# Patient Record
Sex: Male | Born: 1990 | Race: White | Hispanic: No | Marital: Married | Smoking: Never smoker
Health system: Southern US, Community
[De-identification: ages and names within clinical notes are randomized; demographics above are authoritative.]

## PROBLEM LIST (undated history)

## (undated) DIAGNOSIS — F429 Obsessive-compulsive disorder, unspecified: Secondary | ICD-10-CM

## (undated) DIAGNOSIS — I10 Essential (primary) hypertension: Secondary | ICD-10-CM

## (undated) DIAGNOSIS — K219 Gastro-esophageal reflux disease without esophagitis: Secondary | ICD-10-CM

---
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
## 2019-03-22 ENCOUNTER — Ambulatory Visit: Payer: 59 | Admitting: Psychiatry

## 2019-05-28 ENCOUNTER — Ambulatory Visit: Payer: 59 | Admitting: Psychiatry

## 2019-06-11 ENCOUNTER — Ambulatory Visit: Payer: 59 | Admitting: Psychiatry

## 2019-06-25 ENCOUNTER — Ambulatory Visit: Payer: 59 | Admitting: Psychiatry

## 2019-07-18 ENCOUNTER — Encounter: Payer: Self-pay | Admitting: Psychiatry

## 2019-07-18 ENCOUNTER — Other Ambulatory Visit: Payer: Self-pay

## 2019-07-18 ENCOUNTER — Ambulatory Visit (INDEPENDENT_AMBULATORY_CARE_PROVIDER_SITE_OTHER): Payer: 59 | Admitting: Psychiatry

## 2019-07-18 DIAGNOSIS — F4310 Post-traumatic stress disorder, unspecified: Secondary | ICD-10-CM

## 2019-07-18 DIAGNOSIS — F3181 Bipolar II disorder: Secondary | ICD-10-CM | POA: Diagnosis not present

## 2019-07-18 DIAGNOSIS — F422 Mixed obsessional thoughts and acts: Secondary | ICD-10-CM
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
## 2019-07-25 ENCOUNTER — Ambulatory Visit: Payer: 59 | Admitting: Psychiatry

## 2019-08-05 ENCOUNTER — Ambulatory Visit (INDEPENDENT_AMBULATORY_CARE_PROVIDER_SITE_OTHER): Payer: 59 | Admitting: Psychiatry

## 2019-08-05 ENCOUNTER — Encounter: Payer: Self-pay | Admitting: Psychiatry

## 2019-08-05 DIAGNOSIS — F4310 Post-traumatic stress disorder, unspecified: Secondary | ICD-10-CM | POA: Diagnosis not present

## 2019-08-05 NOTE — Progress Notes (Signed)
[facility] Counselor/Therapist Progress Note  Patient ID: Bruce Cross, MRN: [id number],    Date: 08/05/2019  Time Spent: 50 minutes   Treatment Type: Individual Therapy  Reported Symptoms: anxiety  Mental Status Exam:  Appearance:   Casual     Behavior:  Appropriate  Motor:  Normal  Speech/Language:   Clear and Coherent  Affect:  Appropriate  Mood:  anxious  Thought process:  normal  Thought content:    WNL  Sensory/Perceptual disturbances:    WNL  Orientation:  oriented to person, place, time/date and situation  Attention:  Good  Concentration:  Good  Memory:  WNL  Fund of knowledge:   Good  Insight:    Good  Judgment:   Good  Impulse Control:  Good   Risk Assessment: Danger to Self:  No Self-injurious Behavior: No Danger to Others: No Duty to Warn:no Physical Aggression / Violence:No  Access to Firearms a concern: No  Gang Involvement:No   Subjective: I connected with this patient by an approved telecommunication method (video), with his informed consent, and verifying identity and patient privacy.  I was located at my office and patient at his home.  As needed, we discussed the limitations, risks, and security and privacy concerns associated with telehealth service, including the availability and conditions which currently govern in-person appointments and the possibility that 3rd-party payment Bruce Cross not be fully guaranteed and he Bruce Cross be responsible for charges.  After he indicated understanding, we proceeded with the session.  Also discussed treatment planning, as needed, including ongoing verbal agreement with the plan, the opportunity to ask and answer all questions, his demonstrated understanding of instructions, and his readiness to call the office should symptoms worsen or he feels he is in a crisis state and needs more immediate and tangible assistance.   The client had a video appointment today due to a scheduling conflict he had.  He stated that today he  has a lot of agitation at a subjective units of distress of 7+.  He feels it in his back.  His negative cognition is, "it is too much."  I used eye-movement with the client focusing on his agitation.  As he processed he did feel his subjective units of distress dropped down to about around a 5.  When he thought specifically about the CPR class he had to take it went back up to a 7+.  The client noticed that when he was tracking my fingers and just focusing on that that his anxiety went down.  I explained to the client that this is type of mindfulness that he was doing.  I went through that process with him having him sip water to focus on how that tasted in his mouth.  I will also send him a handout on mindfulness practices.  The client was also able to use the eye-movement to invite Bruce Cross into the picture the CPR class that he was taking.  He felt comfort about being able to do that.  He thought if he could have something to focus on in the CPR class then he might feel better making it through.  I suggested the use something for his hand on the baby in the central oil to smell.  The client thought that either 1 of these could work.  His positive cognition was, "I can tolerate this."  Interventions: Mindfulness Meditation, Motivational Interviewing, Solution-Oriented/Positive Psychology, [employer] Desensitization and Reprocessing (EMDR) and Insight-Oriented  Diagnosis:   ICD-10-CM   1.  PTSD (post-traumatic stress disorder)  F43.10     Plan: Mindfulness, mood independent behavior, positive self talk.  Bruce Cross, [facility]

## 2019-09-18 ENCOUNTER — Encounter (HOSPITAL_BASED_OUTPATIENT_CLINIC_OR_DEPARTMENT_OTHER): Payer: Self-pay

## 2019-09-18 ENCOUNTER — Other Ambulatory Visit: Payer: Self-pay

## 2019-09-18 ENCOUNTER — Emergency Department (HOSPITAL_BASED_OUTPATIENT_CLINIC_OR_DEPARTMENT_OTHER): Payer: 59

## 2019-09-18 ENCOUNTER — Observation Stay (HOSPITAL_BASED_OUTPATIENT_CLINIC_OR_DEPARTMENT_OTHER)
Admission: EM | Admit: 2019-09-18 | Discharge: 2019-09-19 | Disposition: A | Discharge status: Home / Self Care | Payer: 59 | Attending: Internal Medicine | Admitting: Internal Medicine

## 2019-09-18 DIAGNOSIS — I639 Cerebral infarction, unspecified: Secondary | ICD-10-CM | POA: Diagnosis not present

## 2019-09-18 DIAGNOSIS — F429 Obsessive-compulsive disorder, unspecified: Secondary | ICD-10-CM | POA: Diagnosis present

## 2019-09-18 DIAGNOSIS — Z8673 Personal history of transient ischemic attack (TIA), and cerebral infarction without residual deficits: Secondary | ICD-10-CM | POA: Diagnosis present

## 2019-09-18 DIAGNOSIS — Z79899 Other long term (current) drug therapy: Secondary | ICD-10-CM | POA: Diagnosis not present

## 2019-09-18 DIAGNOSIS — I10 Essential (primary) hypertension: Secondary | ICD-10-CM | POA: Insufficient documentation

## 2019-09-18 DIAGNOSIS — R739 Hyperglycemia, unspecified: Secondary | ICD-10-CM | POA: Diagnosis present

## 2019-09-18 DIAGNOSIS — K219 Gastro-esophageal reflux disease without esophagitis: Secondary | ICD-10-CM | POA: Diagnosis present

## 2019-09-18 DIAGNOSIS — Z20822 Contact with and (suspected) exposure to covid-19: Secondary | ICD-10-CM | POA: Insufficient documentation

## 2019-09-18 DIAGNOSIS — Z6841 Body Mass Index (BMI) 40.0 and over, adult: Secondary | ICD-10-CM | POA: Diagnosis not present

## 2019-09-18 DIAGNOSIS — R4701 Aphasia: Secondary | ICD-10-CM | POA: Diagnosis present

## 2019-09-18 DIAGNOSIS — E119 Type 2 diabetes mellitus without complications: Secondary | ICD-10-CM | POA: Diagnosis not present

## 2019-09-18 DIAGNOSIS — I63412 Cerebral infarction due to embolism of left middle cerebral artery: Secondary | ICD-10-CM

## 2019-09-18 HISTORY — DX: Obsessive-compulsive disorder, unspecified: F42.9

## 2019-09-18 HISTORY — DX: Gastro-esophageal reflux disease without esophagitis: K21.9

## 2019-09-18 HISTORY — DX: Essential (primary) hypertension: I10

## 2019-09-18 LAB — URINALYSIS, MICROSCOPIC (REFLEX): Squamous Epithelial / HPF: NONE SEEN (ref 0–5)

## 2019-09-18 LAB — BASIC METABOLIC PANEL
Anion gap: 14 (ref 5–15)
BUN: 19 mg/dL (ref 6–20)
CO2: 22 mmol/L (ref 22–32)
Calcium: 10 mg/dL (ref 8.9–10.3)
Chloride: 96 mmol/L — ABNORMAL LOW (ref 98–111)
Creatinine, Ser: 1.01 mg/dL (ref 0.61–1.24)
GFR calc Af Amer: >60 mL/min (ref >60)
GFR calc non Af Amer: >60 mL/min (ref >60)
Glucose, Bld: 276 mg/dL — ABNORMAL HIGH (ref 70–99)
Potassium: 4.3 mmol/L (ref 3.5–5.1)
Sodium: 132 mmol/L — ABNORMAL LOW (ref 135–145)

## 2019-09-18 LAB — URINALYSIS, ROUTINE W REFLEX MICROSCOPIC
Bilirubin Urine: NEGATIVE
Glucose, UA: >=500 mg/dL — AB
Hgb urine dipstick: NEGATIVE
Ketones, ur: NEGATIVE mg/dL
Leukocytes,Ua: NEGATIVE
Nitrite: NEGATIVE
Protein, ur: NEGATIVE mg/dL
Specific Gravity, Urine: 1.015 (ref 1.005–1.030)
pH: 6 (ref 5.0–8.0)

## 2019-09-18 LAB — CBC
HCT: 45.6 % (ref 39.0–52.0)
Hemoglobin: 15.3 g/dL (ref 13.0–17.0)
MCH: 27.8 pg (ref 26.0–34.0)
MCHC: 33.6 g/dL (ref 30.0–36.0)
MCV: 82.8 fL (ref 80.0–100.0)
Platelets: 261 10*3/uL (ref 150–400)
RBC: 5.51 MIL/uL (ref 4.22–5.81)
RDW: 13.5 % (ref 11.5–15.5)
WBC: 9.2 10*3/uL (ref 4.0–10.5)
nRBC: 0 % (ref 0.0–0.2)

## 2019-09-18 LAB — SARS CORONAVIRUS 2 BY RT PCR (HOSPITAL ORDER, PERFORMED IN ~~LOC~~ HOSPITAL LAB): SARS Coronavirus 2: NEGATIVE

## 2019-09-18 MED ORDER — IOHEXOL 350 MG/ML SOLN
100.0000 mL | Freq: Once | INTRAVENOUS | Status: AC | PRN
  Administered 2019-09-18: 100 mL via INTRAVENOUS

## 2019-09-18 MED ORDER — STROKE: EARLY STAGES OF RECOVERY BOOK
Freq: Once | Status: AC
  Filled 2019-09-18: qty 1

## 2019-09-18 MED ORDER — ASPIRIN 81 MG PO CHEW
650.0000 mg | CHEWABLE_TABLET | Freq: Once | ORAL | Status: AC
  Administered 2019-09-18: 650 mg via ORAL
  Filled 2019-09-18: qty 9

## 2019-09-18 MED ORDER — LACTATED RINGERS IV BOLUS
1000.0000 mL | Freq: Once | INTRAVENOUS | Status: AC
  Administered 2019-09-18: 1000 mL via INTRAVENOUS

## 2019-09-18 NOTE — Consult Note (Signed)
Neurology Consultation  Reason for Consult: Word finding difficulty, abnormal CT head Referring Physician: Dr. Gwyneth Sprout, EDP and Dr. Benita Gutter, [facility] hospitalist  CC: Word finding difficulty  History is obtained from: Patient, chart  HPI: Bruce Cross is a 29 y.o. male past medical history of hypertension, PTSD, who is a [employer] manager, presented to [facility] emergency room for evaluation of word finding difficulty. 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  He felt like he was having trouble getting his words out.  One of his colleagues thought he had overheated and provided some fluids.  Symptoms did not really resolve and continued for about 1 to 2 hours.  He went home, his wife noted that he continued to have some garbled speech and the sentences that he was forming were fluent but made no sense.  He also started having some headache on the left side of his head.  No visual deficits.  No tingling numbness.  No coordination deficits.  No gait abnormality. Symptoms did not resolve and that made him come to the emergency room at [facility].  Noncontrast head CT was done because he exhibited signs of expressive aphasia on the ED provider examination.  The CT head revealed 6 cm area of low density in the left temporoparietal junction most consistent with acute infarction, with other differentials being mass.  CT angiogram of the head did not reveal any intracranial vascular occlusion. Denies any head trauma.  Denies any history of headaches.  Denies any prior similar episodes. Denies history of diabetes although sugars were very high in the 270 range on arrival and urine had glycosuria as well. Reports family history of lupus in the mother. Denies any family history of clotting disorders, strokes or heart attacks in young. Denies illicit drug use.  Denies neck pain or neck injury   LKW: 4 PM 09/17/2019 tpa  given?: no, outside the window Premorbid modified Rankin scale (mRS): 0  ROS: Negative except as noted in HPI.  Somewhat limited by his expressive aphasia.  Past Medical History:  Diagnosis Date  . GERD (gastroesophageal reflux disease)   . Hypertension   . OCD (obsessive compulsive disorder)     Family history: Mother-SLE  Social History:   reports that he has never smoked. He has never used smokeless tobacco. He reports previous alcohol use. He reports that he does not use drugs.  Medications  Current Facility-Administered Medications:  .   stroke: mapping our early stages of recovery book, , Does not apply, Once, Milon Dikes, MD   Exam: Current vital signs: BP (!) 134/92 (BP Location: Right Arm)   Pulse 100   Temp 98.5 F (36.9 C) (Oral)   Resp 18   Ht 5\' 11"  (1.803 m)   Wt (!) 168.4 kg   SpO2 97%   BMI 51.78 kg/m  Vital signs in last 24 hours: Temp:  [98.5 F (36.9 C)-99.3 F (37.4 C)] 98.5 F (36.9 C) (08/25 2223) Pulse Rate:  [96-113] 100 (08/25 2223) Resp:  [12-29] 18 (08/25 2223) BP: (134-165)/(87-106) 134/92 (08/25 2223) SpO2:  [95 %-99 %] 97 % (08/25 2223) Weight:  [167.8 kg-168.4 kg] 168.4 kg (08/25 2223) General: Awake alert in no distress, obese. HEENT: Normocephalic atraumatic CVS: Regular rhythm Respiratory: Breathing well saturating normally on room air Abdomen: Obese, nontender Extremities warm well perfused Neurological examination awake alert oriented x3 Speech is nondysarthric Is able to say 2-3 word sentences  normally but when asked to read the long sentences, speech becomes completely garbled and incomprehensible although remains fluent. Naming is intact.  Comprehension is intact.  On repetition-able to repeat short sentences but repetition of long sentences repeats to incomprehensible words. Gets frustrated while trying to speak long sentences due to the above. Cranial: Pupils equal round react light, extraocular movements intact,  visual fields are full without extinction to double simultaneous stimulation, facial sensation is intact, face is symmetric, auditory acuity intact, palate midline, shoulder shrug intact, tongue midline. Motor exam: All 4 extremities are antigravity full-strength with normal tone and normal range of motion. Sensory exam: Intact to light touch without extinction on double 70 stimulation Coordination: Intact finger nose testing bilaterally Gait testing deferred at this time NIH stroke scale-1 for aphasia.   Labs I have reviewed labs in epic and the results pertinent to this consultation are:  CBC    Component Value Date/Time   WBC 9.2 09/18/2019 1409   RBC 5.51 09/18/2019 1409   HGB 15.3 09/18/2019 1409   HCT 45.6 09/18/2019 1409   PLT 261 09/18/2019 1409   MCV 82.8 09/18/2019 1409   MCH 27.8 09/18/2019 1409   MCHC 33.6 09/18/2019 1409   RDW 13.5 09/18/2019 1409    CMP     Component Value Date/Time   NA 132 (L) 09/18/2019 1409   K 4.3 09/18/2019 1409   CL 96 (L) 09/18/2019 1409   CO2 22 09/18/2019 1409   GLUCOSE 276 (H) 09/18/2019 1409   BUN 19 09/18/2019 1409   CREATININE 1.01 09/18/2019 1409   CALCIUM 10.0 09/18/2019 1409   GFRNONAA >60 09/18/2019 1409   GFRAA >60 09/18/2019 1409   Glucose 276, sodium 132, chloride 96.   Imaging I have reviewed the images obtained:  CT-scan of the brain-6 cm region of hypodensity in the left temporoparietal junction most consistent with acute infarction. Other differentials could include a mass. Other considerations given young age is dural venous sinus thrombosis causing venous infarct  Assessment:  29 year old with past medical history of obesity, hypertension, PTSD and also probably undiagnosed diabetes, presenting for evaluation of sudden onset of word finding difficulty with last known normal somewhere around 4 PM on 09/17/2019. CT head concerning for a hypodensity in the left temporoparietal junction.  Most likely consistent  with a stroke.  Differentials include a mass as well but less likely. Given the location of the hypodensity, venous infarct is also possible due to dural venous sinus thrombosis (he was overheated, dehydrated in the sun coaching could have precipitated dural venous sinus thrombosis) Other possibilities are dissection of the cervical carotids. CTA head with no obvious thrombus.  CTA neck was not done at the outside ER. Needs further imaging and work-up  Impression: Acute ischemic stroke in young: Potential etiological factors: Hypercoagulable state given family history of lupus, cervical carotid artery dissection, dural venous sinus thrombosis, or underlying risk factors such as undiagnosed diabetes and hypertension and obesity.  Recommendations: -Admit to hospitalist -Telemetry -Frequent neurochecks -MRI brain with and without contrast -MRA of the head without contrast -MRA of the neck with and without contrast -MRV head to rule out dural venous sinus thrombosis -I have taken the liberty to order all the imaging studies. -Hypercoagulable panel including work-up for SLE-ordered -A1c-ordered -Lipid panel-ordered -Transthoracic echocardiogram -ordered.  Might need TEE but will await TTE results before deciding on that. -PT OT speech therapy -N.p.o. until cleared by bedside swallow evaluation-his RN informed me that he is passed bedside swallow  evaluation.  Can have heart healthy carb modified diet. -Allow for permissive hypertension-treat only if systolic blood pressures are greater than 220 on a as needed basis. -Has been given aspirin 650 mg per conversation between the EDP and the daytime neurology MD.  I would continue aspirin 81 for now. -I would also do high intensity statin-atorvastatin 80 mg now and daily  I have answered all his questions. Stroke team will follow with you.  -- Milon Dikes, MD [facility] Pager: [phone] If 7pm to 7am, please call on call as  listed on AMION.

## 2019-09-18 NOTE — ED Triage Notes (Signed)
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 Left orbital HA, clear speak in triage, denies blurry vision.

## 2019-09-18 NOTE — ED Provider Notes (Addendum)
[facility] EMERGENCY DEPARTMENT Provider Note   CSN: [id number] Arrival date & time: 09/18/19  1348     History No chief complaint on file.   Bruce Cross is a 29 y.o. male.  Patient is a 29 year old male with a history of hypertension, GERD and OCD who presents today with expressive aphasia.  Patient's wife gives most of the history.  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  He was treated by the trainer who noted at that time he was having some word finding difficulty.  However once he cooled down he seemed to improve and went home around 430 5:00.  When his wife got home at 7 she noticed that things were not right.  She reports that he cannot say what he wants to say and will oftentimes say words that are not appropriate in a sentence.  He attempted to read a book to his child this morning and could not get the words out right.  Patient here stated a sentence where he was fire instead of hot.  He seems very frustrated when trying to speak.  However denies any walking abnormalities, unilateral weakness or numbness.  He is complaining of the pain in his left temple area that is been present since yesterday it is constant and nagging but not the worst of his life.  He denies any vision changes.  No prior history of similar symptoms.  He does have a history of hypertension and has not missed any of his blood pressure medication.  He has not received the Covid vaccine but did have Covid last fall.  He denies any recent cough congestion or fever.  The history is provided by the patient.       Past Medical History:  Diagnosis Date  . GERD (gastroesophageal reflux disease)   . Hypertension   . OCD (obsessive compulsive disorder)     There are no problems to display for this patient.   History reviewed. No pertinent surgical history.     History reviewed. No pertinent family history.  Social History    Tobacco Use  . Smoking status: Never Smoker  . Smokeless tobacco: Never Used  Substance Use Topics  . Alcohol use: Not Currently  . Drug use: Never    Home Medications Prior to Admission medications   Medication Sig Start Date End Date Taking? Authorizing Provider  ARIPiprazole (ABILIFY) 15 MG tablet Take 1 tablet by mouth daily. 09/08/19  Yes [provider]  fluvoxaMINE (LUVOX) 100 MG tablet 300 mg. 09/05/19  Yes [provider]  mesalamine (PENTASA) 250 MG CR capsule Take 2,000 mg by mouth 2 (two) times daily.   Yes [provider]  riluzole (RILUTEK) 50 MG tablet 100 mg. 09/08/19  Yes [provider]  lisinopril (ZESTRIL) 20 MG tablet Take 20 mg by mouth 2 (two) times daily.    [provider]  methylphenidate (RITALIN LA) 40 MG 24 hr capsule Take 120 mg by mouth daily. 07/26/19   [provider]    Allergies    Patient has no allergy information on record.  Review of Systems   Review of Systems  All other systems reviewed and are negative.   Physical Exam Updated Vital Signs BP (!) 152/106 (BP Location: Right Wrist)   Pulse (!) 108   Temp 99.3 F (37.4 C) (Oral)   Resp 12   Ht 5\' 11"  (1.803 m)  Wt (!) 167.8 kg   SpO2 99%   BMI 51.60 kg/m   Physical Exam Vitals and nursing note reviewed.  Constitutional:      General: He is not in acute distress.    Appearance: Normal appearance. He is well-developed. He is obese.  HENT:     Head: Normocephalic and atraumatic.  Eyes:     General: No visual field deficit.    Conjunctiva/sclera: Conjunctivae normal.     Pupils: Pupils are equal, round, and reactive to light.  Cardiovascular:     Rate and Rhythm: Regular rhythm. Tachycardia present.     Heart sounds: No murmur heard.   Pulmonary:     Effort: Pulmonary effort is normal. No respiratory distress.     Breath sounds: Normal breath sounds. No wheezing or rales.  Abdominal:     General: There is no distension.      Palpations: Abdomen is soft.     Tenderness: There is no abdominal tenderness. There is no guarding or rebound.  Musculoskeletal:        General: No tenderness. Normal range of motion.     Cervical back: Normal range of motion and neck supple. No tenderness.  Skin:    General: Skin is warm and dry.     Capillary Refill: Capillary refill takes less than 2 seconds.     Findings: No erythema or rash.  Neurological:     Mental Status: He is alert and oriented to person, place, and time.     Cranial Nerves: Dysarthria present. No facial asymmetry.     Motor: Motor function is intact. No weakness or pronator drift.     Coordination: Coordination is intact. [facility] normal.     Gait: Gait is intact.     Comments: Expressive aphasia  Psychiatric:        Behavior: Behavior normal.     ED Results / Procedures / Treatments   Labs (all labs ordered are listed, but only abnormal results are displayed) Labs Reviewed  BASIC METABOLIC PANEL - Abnormal; Notable for the following components:      Result Value   Sodium 132 (*)    Chloride 96 (*)    Glucose, Bld 276 (*)    All other components within normal limits  URINALYSIS, ROUTINE W REFLEX MICROSCOPIC - Abnormal; Notable for the following components:   Glucose, UA >=500 (*)    All other components within normal limits  URINALYSIS, MICROSCOPIC (REFLEX) - Abnormal; Notable for the following components:   Bacteria, UA MANY (*)    All other components within normal limits  SARS CORONAVIRUS 2 BY RT PCR ([facility]OSP[facility])  CBC    EKG EKG Interpretation  Date/Time:  Wednesday September 18 2019 13:57:13 EDT Ventricular Rate:  111 PR Interval:  136 QRS Duration: 86 QT Interval:  330 QTC Calculation: 448 R Axis:   81 Text Interpretation: Sinus tachycardia Otherwise normal ECG No previous tracing Confirmed by Gwyneth SproutPlunkett, Foster Frericks ([id number]) on 09/18/2019 4:58:52 PM   Radiology CT Angio Head W or Wo  Contrast  Result Date: 09/18/2019 CLINICAL DATA:  Confusion and speech disturbance last night. EXAM: CT ANGIOGRAPHY HEAD TECHNIQUE: Multidetector CT imaging of the head was performed using the standard protocol during bolus administration of intravenous contrast. Multiplanar CT image reconstructions and MIPs were obtained to evaluate the vascular anatomy. CONTRAST:  100mL OMNIPAQUE IOHEXOL 350 MG/ML SOLN COMPARISON:  None. FINDINGS: CT HEAD Brain: The brainstem and cerebellum are  normal. There is a 5-6 cm region of low-density in the left temporoparietal junction region most consistent with acute infarction. Mass lesion is possible but not favored. No sign of hemorrhage. No midline shift. No other brain abnormality. No hydrocephalus or extra-axial collection. Vascular: Negative on the standard head CT. Skull: Normal Sinuses: Clear Orbits: Normal CTA HEAD Anterior circulation: Both internal carotid arteries are widely patent through the skull base and siphon regions. The anterior and middle cerebral vessels are patent without proximal stenosis, aneurysm or vascular malformation. I cannot identify an occluded middle cerebral artery branch. Posterior circulation: Both vertebral arteries widely patent to the basilar. No basilar stenosis. Posterior circulation branch vessels are patent. Left posterior cerebral artery receives most of it supply from the anterior circulation. Venous sinuses: Patent and normal. Anatomic variants: None significant. IMPRESSION: 1. 5-6 cm region of low-density in the left temporoparietal junction region most consistent with acute infarction. Mass lesion is possible but not favored. No sign of hemorrhage. 2. No intracranial large or medium vessel occlusion at this time, or correctable proximal stenosis. I cannot identify an occluded left middle cerebral artery branch. Electronically Signed   By: Paulina Fusi M.D.   On: 09/18/2019 17:37    Procedures Procedures (including critical care  time)  Medications Ordered in ED Medications  lactated ringers bolus 1,000 mL (1,000 mLs Intravenous New Bag/Given 09/18/19 1707)    ED Course  I have reviewed the triage vital signs and the nursing notes.  Pertinent labs & imaging results that were available during my care of the patient were reviewed by me and considered in my medical decision making (see chart for details).    MDM Rules/Calculators/A&P                          Patient presenting today with symptoms concerning for stroke versus head bleed.  Could be heat related stroke.  Patient has expressive aphasia on my exam.  Patient is not a code stroke his symptoms have now been present for over 24 hours.  He has no LVO findings.  Labs show hyperglycemia of 276 but otherwise normal CBC and renal function.  EKG with sinus tachycardia but otherwise normal.  Vital signs with persistent tachycardia and hypertension.  We will do a CT/CTA of the head.  Will discuss with neurology.  Patient given 1 L of fluid.  5:57 PM CT/CTA of the head shows normal vascular structures but 5 to 6 cm region of low density in the left temporoparietal junction region most consistent with acute infarct.  Given patient's ongoing symptoms concerning for stroke and now CT findings feel that he needs admission for stroke work-up.  Will discuss with neurology and admit to the hospitalist. Dr. Otelia Limes recommended 650 ASA and 81mg  daily MDM Number of Diagnoses or Management Options   Amount and/or Complexity of Data Reviewed Clinical lab tests: ordered and reviewed Tests in the radiology section of CPT: ordered and reviewed Tests in the medicine section of CPT: reviewed and ordered Decide to obtain previous medical records or to obtain history from someone other than the patient: yes Obtain history from someone other than the patient: yes Review and summarize past medical records: yes Discuss the patient with other providers: yes Independent visualization of  images, tracings, or specimens: yes  Risk of Complications, Morbidity, and/or Mortality Presenting problems: high Diagnostic procedures: low Management options: low  Patient Progress Patient progress: stable    Final Clinical Impression(s) / ED  Diagnoses Final diagnoses:  Cerebrovascular accident (CVA), unspecified mechanism [facility])    Rx / DC Orders ED Discharge Orders    None       Gwyneth Sprout, MD 09/18/19 1759    Gwyneth Sprout, MD 09/18/19 [phone]

## 2019-09-19 ENCOUNTER — Encounter (HOSPITAL_COMMUNITY): Payer: Self-pay | Admitting: Family Medicine

## 2019-09-19 ENCOUNTER — Observation Stay (HOSPITAL_COMMUNITY): Payer: 59

## 2019-09-19 ENCOUNTER — Observation Stay (HOSPITAL_BASED_OUTPATIENT_CLINIC_OR_DEPARTMENT_OTHER): Payer: 59

## 2019-09-19 ENCOUNTER — Encounter (HOSPITAL_COMMUNITY): Payer: Self-pay | Admitting: Certified Registered"

## 2019-09-19 ENCOUNTER — Encounter (HOSPITAL_COMMUNITY)
Admission: EM | Disposition: A | Discharge status: Home / Self Care | Payer: Self-pay | Source: Home / Self Care | Attending: Emergency Medicine

## 2019-09-19 DIAGNOSIS — K219 Gastro-esophageal reflux disease without esophagitis: Secondary | ICD-10-CM | POA: Diagnosis present

## 2019-09-19 DIAGNOSIS — Z9989 Dependence on other enabling machines and devices: Secondary | ICD-10-CM

## 2019-09-19 DIAGNOSIS — I10 Essential (primary) hypertension: Secondary | ICD-10-CM | POA: Diagnosis present

## 2019-09-19 DIAGNOSIS — F429 Obsessive-compulsive disorder, unspecified: Secondary | ICD-10-CM | POA: Diagnosis not present

## 2019-09-19 DIAGNOSIS — Q211 Atrial septal defect: Secondary | ICD-10-CM

## 2019-09-19 DIAGNOSIS — I6389 Other cerebral infarction: Secondary | ICD-10-CM | POA: Diagnosis not present

## 2019-09-19 DIAGNOSIS — I639 Cerebral infarction, unspecified: Secondary | ICD-10-CM | POA: Diagnosis not present

## 2019-09-19 DIAGNOSIS — I63412 Cerebral infarction due to embolism of left middle cerebral artery: Secondary | ICD-10-CM | POA: Diagnosis not present

## 2019-09-19 DIAGNOSIS — R739 Hyperglycemia, unspecified: Secondary | ICD-10-CM | POA: Diagnosis not present

## 2019-09-19 DIAGNOSIS — G4733 Obstructive sleep apnea (adult) (pediatric): Secondary | ICD-10-CM

## 2019-09-19 DIAGNOSIS — E1165 Type 2 diabetes mellitus with hyperglycemia: Secondary | ICD-10-CM

## 2019-09-19 LAB — LDL CHOLESTEROL, DIRECT: Direct LDL: 214 mg/dL — ABNORMAL HIGH (ref 0–99)

## 2019-09-19 LAB — RPR
RPR Ser Ql: NONREACTIVE
RPR Ser Ql: NONREACTIVE

## 2019-09-19 LAB — VITAMIN B12
Vitamin B-12: 436 pg/mL (ref 180–914)
Vitamin B-12: 487 pg/mL (ref 180–914)

## 2019-09-19 LAB — ANTITHROMBIN III: AntiThromb III Func: 102 % (ref 75–120)

## 2019-09-19 LAB — ECHOCARDIOGRAM COMPLETE
Area-P 1/2: 2.69 cm2
Height: 71 in
S' Lateral: 3.2 cm
Weight: 5940.07 oz

## 2019-09-19 LAB — LIPID PANEL
Cholesterol: 352 mg/dL — ABNORMAL HIGH (ref 0–200)
HDL: 32 mg/dL — ABNORMAL LOW (ref >40)
LDL Cholesterol: UNDETERMINED mg/dL (ref 0–99)
Total CHOL/HDL Ratio: 11 RATIO
Triglycerides: 537 mg/dL — ABNORMAL HIGH (ref <150)
VLDL: UNDETERMINED mg/dL (ref 0–40)

## 2019-09-19 LAB — TSH
TSH: 1.678 u[IU]/mL (ref 0.350–4.500)
TSH: 1.728 u[IU]/mL (ref 0.350–4.500)

## 2019-09-19 LAB — GLUCOSE, CAPILLARY: Glucose-Capillary: 159 mg/dL — ABNORMAL HIGH (ref 70–99)

## 2019-09-19 LAB — HEMOGLOBIN A1C
Hgb A1c MFr Bld: 9 % — ABNORMAL HIGH (ref 4.8–5.6)
Mean Plasma Glucose: 211.6 mg/dL

## 2019-09-19 LAB — HIV ANTIBODY (ROUTINE TESTING W REFLEX): HIV Screen 4th Generation wRfx: NONREACTIVE

## 2019-09-19 SURGERY — CANCELLED PROCEDURE

## 2019-09-19 MED ORDER — ATORVASTATIN CALCIUM 80 MG PO TABS: 80.0000 mg | ORAL_TABLET | Freq: Every day | ORAL | 3 refills | Status: AC

## 2019-09-19 MED ORDER — ONDANSETRON HCL 4 MG/2ML IJ SOLN: 4.0000 mg | Freq: Four times a day (QID) | INTRAMUSCULAR | Status: DC | PRN

## 2019-09-19 MED ORDER — ATORVASTATIN CALCIUM 40 MG PO TABS: 40.0000 mg | ORAL_TABLET | Freq: Once | ORAL | Status: DC

## 2019-09-19 MED ORDER — CLOPIDOGREL BISULFATE 75 MG PO TABS
75.0000 mg | ORAL_TABLET | Freq: Every day | ORAL | Status: DC
  Administered 2019-09-19: 75 mg via ORAL
  Filled 2019-09-19: qty 1

## 2019-09-19 MED ORDER — ASPIRIN 81 MG PO TBEC: 81.0000 mg | DELAYED_RELEASE_TABLET | Freq: Every day | ORAL | 11 refills | Status: AC

## 2019-09-19 MED ORDER — CLOPIDOGREL BISULFATE 75 MG PO TABS
75.0000 mg | ORAL_TABLET | Freq: Every day | ORAL | 0 refills | Status: DC
Start: 2019-09-19 — End: 2019-10-15

## 2019-09-19 MED ORDER — ACETAMINOPHEN 325 MG PO TABS: 650.0000 mg | ORAL_TABLET | Freq: Four times a day (QID) | ORAL | Status: DC | PRN

## 2019-09-19 MED ORDER — ASPIRIN EC 81 MG PO TBEC: 81.0000 mg | DELAYED_RELEASE_TABLET | Freq: Every day | ORAL | Status: DC

## 2019-09-19 MED ORDER — ASPIRIN EC 325 MG PO TBEC
325.0000 mg | DELAYED_RELEASE_TABLET | Freq: Every day | ORAL | Status: DC
  Administered 2019-09-19: 325 mg via ORAL
  Filled 2019-09-19: qty 1

## 2019-09-19 MED ORDER — ONDANSETRON HCL 4 MG PO TABS: 4.0000 mg | ORAL_TABLET | Freq: Four times a day (QID) | ORAL | Status: DC | PRN

## 2019-09-19 MED ORDER — ACETAMINOPHEN 650 MG RE SUPP: 650.0000 mg | Freq: Four times a day (QID) | RECTAL | Status: DC | PRN

## 2019-09-19 MED ORDER — PERFLUTREN LIPID MICROSPHERE
1.0000 mL | INTRAVENOUS | Status: AC | PRN
  Administered 2019-09-19: 3 mL via INTRAVENOUS
  Filled 2019-09-19: qty 10

## 2019-09-19 MED ORDER — IOHEXOL 350 MG/ML SOLN
75.0000 mL | Freq: Once | INTRAVENOUS | Status: AC | PRN
  Administered 2019-09-19: 75 mL via INTRAVENOUS

## 2019-09-19 MED ORDER — ATORVASTATIN CALCIUM 40 MG PO TABS
40.0000 mg | ORAL_TABLET | Freq: Every day | ORAL | Status: DC
  Administered 2019-09-19: 40 mg via ORAL
  Filled 2019-09-19: qty 1

## 2019-09-19 MED ORDER — METFORMIN HCL 500 MG PO TABS: 500.0000 mg | ORAL_TABLET | Freq: Two times a day (BID) | ORAL | 11 refills | Status: DC

## 2019-09-19 MED ORDER — INSULIN ASPART 100 UNIT/ML ~~LOC~~ SOLN
0.0000 [IU] | Freq: Three times a day (TID) | SUBCUTANEOUS | Status: DC
  Administered 2019-09-19: 4 [IU] via SUBCUTANEOUS

## 2019-09-19 MED ORDER — ATORVASTATIN CALCIUM 80 MG PO TABS: 80.0000 mg | ORAL_TABLET | Freq: Every day | ORAL | Status: DC

## 2019-09-19 NOTE — Progress Notes (Signed)
Off unit via wheelchair to MRI.

## 2019-09-19 NOTE — Plan of Care (Signed)
  Problem: Education: Goal: Knowledge of disease or condition will improve 09/19/2019 1209 by Dallas Breeding, RN Outcome: Progressing 09/19/2019 1024 by Dallas Breeding, RN Outcome: Progressing   Problem: Activity: Goal: Risk for activity intolerance will decrease Outcome: Progressing   Problem: Nutrition: Goal: Adequate nutrition will be maintained Outcome: Progressing   Problem: Coping: Goal: Level of anxiety will decrease Outcome: Progressing

## 2019-09-19 NOTE — Discharge Summary (Signed)
Physician Discharge Summary  Bruce Cross GNO:[id number] DOB: [birth year] DOA: 09/18/2019  PCP: Wendall Mola, MD  Admit date: 09/18/2019 Discharge date: 09/19/2019  Admitted From: Home  Disposition:  Home   Recommendations for Outpatient Follow-up:  1. Follow up with PCP in 1-2 weeks 2. Please obtain BMP/CBC in one week 3. Needs further risk factors modification. Increase metformin as tolerated. Consider start second medication for better Diabetes control.  4. Needs to follow up with neurology for TEE, and further stroke care.  5. Needs Lipid panel in 4-8 weeks.  6. Follow up with Outpatient speech   Home Health: none  Discharge Condition; stable.  CODE STATUS: Full code Diet recommendation: Carb Modified   Brief/Interim Summary:  HPI perform by Dr Robb Matar;  Bruce Cross is a 29 y.o. 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  He reports that he started feeling very hot and nearly passed out.  One of his colleagues helped him to sit down, but also noticed that he was having trouble talking.  He seemed to be better after he cooled off and went home.  He also mentioned when his wife got home she also noticed that he was having difficulty putting sentences together.  Today in the morning he was attempting to read a book to his trial and was unable to read the words right.  He has had left temporal headache since yesterday.  However, he has not had blurred vision, focal weakness or numbness.  He denies chest pain, palpitations, diaphoresis, PND, orthopnea or pitting edema of the lower extremities.  No abdominal pain, nausea, vomiting, diarrhea, constipation, melena or hematochezia.  Denies  dysuria, frequency or nocturia.  No polyuria, polydipsia, polyphagia or blurred vision.  ED Course: Initial vital signs were temperature 99.3 F, pulse 113, respirations 21, blood pressure 145/89 mmHg O2 sat 99% on room air.  His urinalysis showed glucosuria more than 500 mg/dL and many bacteria on microscopic examination, but all other values were within expected range.  CBC was normal.  Sodium is 132, chloride 96 which normalized once corrected to hyperglycemia of 276 mg/dL.  The rest of the BMP values are unremarkable.  Imaging: CTA head with and without contrast show a 5 to 6 cm region of low density in the left temporoparietal junction most consistent with acute infarction.  Mass lesion is a possibility, but no Lefaivre.  There is no sign of hemorrhage.  No intracranial large or medium vessel occlusion at this time.  There were no correctable proximal stenosis.     Acute stroke; Left MCA territory MRI : Moderate-sized acute ischemic left MCA territory infarct involving the left temporoccipital region, posterior left MCA distribution. Associated mild petechial hemorrhage without frank hemorrhagic transformation or significant regional mass effect. Otherwise normal brain MRI. Bubble study to be done prior to discharge.  Decline TEE in patient. Plan to be perform out patient.  Discharge on aspirin and plavix for 3 week, then aspirin alone.  Discharge on lipitor.  Out patient speech.  LDL 214, triglyceride 537. HbA1c 9.0  HTN; Resume lisinopril. To start 8/27.   New Diagnosis of Diabetes;  Start metformin. Will need titration up out patient.  Will need second medication to achieve glucose controlled.  Morbid Obesity; need weight loss.   OCD; Continue aripiprazole, fluvoxamine riluzole and methylphenidate  Discharge Diagnoses:  Principal Problem:   Stroke [facility]) Active Problems:   Hyperglycemia   Obesity, morbid, BMI 50 or higher (HCC)   Hypertension   GERD (gastroesophageal  reflux disease)   OCD (obsessive compulsive disorder)    Discharge Instructions  Discharge Instructions    Diet - low sodium heart healthy   Complete by: As directed    Diet Carb Modified   Complete by: As directed    Increase activity slowly   Complete by: As directed      Allergies as of 09/19/2019      Reactions   Amoxicillin Diarrhea, Nausea Only, Anaphylaxis   Total body discomfort Total body discomfort   Cat Hair Extract Shortness Of Breath   Doxycycline Anaphylaxis, Anxiety   Total body discomfort, Stomach inflammation    Pecan Nut (diagnostic) Anaphylaxis   Telmisartan Other (See Comments)   headache   Montelukast    Other reaction(s): Confusion (intolerance)      Medication List    TAKE these medications   ARIPiprazole 15 MG tablet Commonly known as: ABILIFY Take 15 mg by mouth daily.   aspirin 81 MG EC tablet Take 1 tablet (81 mg total) by mouth daily. Swallow whole. Start taking on: September 20, 2019   atorvastatin 80 MG tablet Commonly known as: LIPITOR Take 1 tablet (80 mg total) by mouth daily. Start taking on: September 20, 2019   clopidogrel 75 MG tablet Commonly known as: PLAVIX Take 1 tablet (75 mg total) by mouth daily.   Dexilant 60 MG capsule Generic drug: dexlansoprazole Take 60 mg by mouth daily.   Flovent HFA 220 MCG/ACT inhaler Generic drug: fluticasone Inhale 2 puffs into the lungs 2 (two) times daily.   fluvoxaMINE 100 MG tablet Commonly known as: LUVOX Take 150 mg by mouth daily.   lisinopril 20 MG tablet Commonly known as: ZESTRIL Take 20 mg by mouth 2 (two) times daily.   mesalamine 500 MG CR capsule Commonly known as: PENTASA Take 2,000 mg by mouth 2 (two) times daily.   metFORMIN 500 MG tablet Commonly known as: Glucophage Take 1 tablet (500 mg total) by mouth 2 (two) times daily with a meal. Start taking on: September 20, 2019   Methylphenidate HCl ER (LA) 60 MG Cp24 Take 60 mg by mouth 2 (two) times daily.    riluzole 50 MG tablet Commonly known as: RILUTEK Take 50 mg by mouth every 12 (twelve) hours.   Zetonna 37 MCG/ACT Aers Generic drug: Ciclesonide Inhale 2 puffs into the lungs daily.       Allergies  Allergen Reactions  . Amoxicillin Diarrhea, Nausea Only and Anaphylaxis    Total body discomfort Total body discomfort   . Cat Hair Extract Shortness Of Breath  . Doxycycline Anaphylaxis and Anxiety    Total body discomfort, Stomach inflammation   . Pecan Nut (Diagnostic) Anaphylaxis  . Telmisartan Other (See Comments)    headache  . Montelukast     Other reaction(s): Confusion (intolerance)    Consultations:  Neurology    Procedures/Studies: CT Angio Head W or Wo Contrast  Result Date: 09/18/2019 CLINICAL DATA:  Confusion and speech disturbance last night. EXAM: CT ANGIOGRAPHY HEAD TECHNIQUE: Multidetector CT imaging of the head was performed using the standard protocol during bolus administration of intravenous contrast. Multiplanar CT image reconstructions and MIPs were obtained to evaluate the vascular anatomy. CONTRAST:  OMNIPAQUE IOHEXOL 350 MG/ML  SOLN COMPARISON:  None. FINDINGS: CT HEAD Brain: The brainstem and cerebellum are normal. There is a 5-6 cm region of low-density in the left temporoparietal junction region most consistent with acute infarction. Mass lesion is possible but not favored. No sign of hemorrhage. No midline shift. No other brain abnormality. No hydrocephalus or extra-axial collection. Vascular: Negative on the standard head CT. Skull: Normal Sinuses: Clear Orbits: Normal CTA HEAD Anterior circulation: Both internal carotid arteries are widely patent through the skull base and siphon regions. The anterior and middle cerebral vessels are patent without proximal stenosis, aneurysm or vascular malformation. I cannot identify an occluded middle cerebral artery branch. Posterior circulation: Both vertebral arteries widely patent to the basilar. No basilar  stenosis. Posterior circulation branch vessels are patent. Left posterior cerebral artery receives most of it supply from the anterior circulation. Venous sinuses: Patent and normal. Anatomic variants: None significant. IMPRESSION: 1. 5-6 cm region of low-density in the left temporoparietal junction region most consistent with acute infarction. Mass lesion is possible but not favored. No sign of hemorrhage. 2. No intracranial large or medium vessel occlusion at this time, or correctable proximal stenosis. I cannot identify an occluded left middle cerebral artery branch. Electronically Signed   By: Paulina Fusi M.D.   On: 09/18/2019 17:37   CT ANGIO NECK W OR WO CONTRAST  Result Date: 09/19/2019 CLINICAL DATA:  Stroke. EXAM: CT ANGIOGRAPHY NECK TECHNIQUE: Multidetector CT imaging of the neck was performed using the standard protocol during bolus administration of intravenous contrast. Multiplanar CT image reconstructions and MIPs were obtained to evaluate the vascular anatomy. Carotid stenosis measurements (when applicable) are obtained utilizing NASCET criteria, using the distal internal carotid diameter as the denominator. CONTRAST:  60mL OMNIPAQUE IOHEXOL 350 MG/ML SOLN COMPARISON:  MRI head 09/19/2019.  CTA head 09/18/2019 FINDINGS: Aortic arch: Standard branching. Imaged portion shows no evidence of aneurysm or dissection. No significant stenosis of the major arch vessel origins. Right carotid system: Normal right carotid system. Negative for atherosclerotic disease, dissection, or stenosis. Left carotid system: Normal left carotid system. Negative for atherosclerotic disease, dissection, or stenosis. Vertebral arteries: Both vertebral arteries are normal and patent to the basilar Skeleton: No focal skeletal abnormality. Other neck: Negative for mass or adenopathy Upper chest: Lung apices clear bilaterally. IMPRESSION: Negative CTA neck. Electronically Signed   By: Marlan Palau M.D.   On: 09/19/2019 09:29    MR ANGIO HEAD WO CONTRAST  Result Date: 09/19/2019 CLINICAL DATA:  Initial evaluation for acute neuro deficit, stroke suspected. Left orbital headache. EXAM: MRA HEAD WITHOUT CONTRAST TECHNIQUE: Angiographic images of the Circle of Willis were obtained using MRA technique without intravenous contrast. COMPARISON:  Prior CTA from 09/18/2019. FINDINGS: ANTERIOR CIRCULATION: Visualized distal cervical segments of the internal carotid arteries are widely patent with symmetric antegrade flow. Petrous, cavernous, and supraclinoid ICAs widely patent without stenosis or other abnormality. Ophthalmic arteries patent proximally. ICA termini well perfused. A1 segments patent bilaterally. Normal anterior communicating artery complex. Anterior cerebral arteries widely patent to their distal aspects without stenosis. No M1 stenosis or occlusion. Normal MCA bifurcations. Distal MCA branches well perfused and symmetric. No visible left MCA branch occlusion. POSTERIOR CIRCULATION: Vertebral arteries patent to the vertebrobasilar junction without stenosis. Left vertebral artery slightly dominant. Neither PICA well visualized. Basilar patent to its distal aspect without stenosis. Superior cerebral arteries patent bilaterally. Right PCA supplied primarily via the basilar. Left PCA supplied via a hypoplastic left P1 segment and prominent left posterior communicating artery. Both PCAs well  perfused to their distal aspects without stenosis. No intracranial aneurysm or other vascular abnormality. IMPRESSION: Normal intracranial MRA. No large vessel occlusion, hemodynamically significant stenosis, or other vascular abnormality. No aneurysm. Electronically Signed   By: Rise Mu M.D.   On: 09/19/2019 03:45   MR BRAIN WO CONTRAST  Result Date: 09/19/2019 CLINICAL DATA:  Follow-up examination for acute stroke. EXAM: MRI HEAD WITHOUT CONTRAST TECHNIQUE: Multiplanar, multiecho pulse sequences of the brain and surrounding  structures were obtained without intravenous contrast. COMPARISON:  Prior CTA from 09/18/2019. FINDINGS: Brain: Moderate size confluent area of restricted diffusion involving the left temporal occipital region again seen, consistent with posterior left MCA territory infarct, corresponding with abnormality on prior CT. Overall size and distribution is stable. Associated mild petechial hemorrhage without frank hemorrhagic transformation or significant regional mass effect. Remainder the brain is normal in appearance. No other focal parenchymal signal abnormality. No other evidence for acute or chronic ischemia. No other foci of susceptibility artifact to suggest acute or chronic intracranial hemorrhage. No mass lesion, midline shift or mass effect. No hydrocephalus or extra-axial fluid collection. Pituitary gland suprasellar region normal. Midline structures intact. Vascular: Major intracranial vascular flow voids are well maintained and normal. Skull and upper cervical spine: Craniocervical junction within normal limits. Bone marrow signal intensity normal. No scalp soft tissue abnormality. Sinuses/Orbits: Globes and orbital soft tissues within normal limits. Paranasal sinuses are largely clear. No significant mastoid effusion. Inner ear structures grossly normal. Other: None. IMPRESSION: 1. Moderate-sized acute ischemic left MCA territory infarct involving the left temporoccipital region, posterior left MCA distribution. Associated mild petechial hemorrhage without frank hemorrhagic transformation or significant regional mass effect. 2. Otherwise normal brain MRI. Electronically Signed   By: Rise Mu M.D.   On: 09/19/2019 03:58   MR Venogram Head  Result Date: 09/19/2019 CLINICAL DATA:  Initial evaluation for possible dural sinus thrombosis. EXAM: MR VENOGRAM OF THE HEAD WITHOUT CONTRAST TECHNIQUE: Angiographic images of the intracranial venous structures were obtained using MRV technique without  intravenous contrast. COMPARISON:  Prior CTA from 09/18/2019. FINDINGS: Normal flow related signal seen throughout the superior sagittal sinus to the level of the torcula. Torcula itself is patent. Right transverse and sigmoid sinuses are widely patent as is the visualized proximal right internal jugular vein. Left transverse sinus hypoplastic and not well seen proximally, but appears widely patent and normal distal to the vein of Labbe. Left sigmoid sinus is patent as is the visualized proximal left internal jugular vein. Straight sinus, vein of Galen, internal cerebral veins and basal veins of Rosenthal are patent. No evidence for dural sinus thrombosis. No dural sinus stenosis or other abnormality. IMPRESSION: Normal intracranial MRV. No evidence for dural sinus thrombosis. Electronically Signed   By: Rise Mu M.D.   On: 09/19/2019 03:49     Subjective: still having difficulty finding world.  Wife report , episodes fluctuates.   Discharge Exam: Vitals:   09/19/19 0332 09/19/19 0935  BP: (!) 156/104 (!) 149/83  Pulse: 92 99  Resp: 20 20  Temp: 98 F (36.7 C) 98.8 F (37.1 C)  SpO2: 96% 99%     General: Pt is alert, awake, not in acute distress Cardiovascular: RRR, S1/S2 +, no rubs, no gallops Respiratory: CTA bilaterally, no wheezing, no rhonchi Abdominal: Soft, NT, ND, bowel sounds + Extremities: no edema, no cyanosis    The results of significant diagnostics from this hospitalization (including imaging, microbiology, ancillary and laboratory) are listed below for reference.     Microbiology: Recent  Results (from the past 240 hour(s))  SARS Coronavirus 2 by RT PCR (hospital order, performed in [facility] hospital lab) Nasopharyngeal Nasopharyngeal Swab     Status: None   Collection Time: 09/18/19  5:23 PM   Specimen: Nasopharyngeal Swab  Result Value Ref Range Status   SARS Coronavirus 2 NEGATIVE NEGATIVE Final    Comment: (NOTE) SARS-CoV-2 target nucleic acids  are NOT DETECTED.  The SARS-CoV-2 RNA is generally detectable in upper and lower respiratory specimens during the acute phase of infection. The lowest concentration of SARS-CoV-2 viral copies this assay can detect is 250 copies / mL. A negative result does not preclude SARS-CoV-2 infection and should not be used as the sole basis for treatment or other patient management decisions.  A negative result may occur with improper specimen collection / handling, submission of specimen other than nasopharyngeal swab, presence of viral mutation(s) within the areas targeted by this assay, and inadequate number of viral copies (<250 copies / mL). A negative result must be combined with clinical observations, patient history, and epidemiological information.  Fact Sheet for Patients:   BoilerBrush.com.cy  Fact Sheet for Healthcare Providers: https://pope.com/  This test is not yet approved or  cleared by the [place] FDA and has been authorized for detection and/or diagnosis of SARS-CoV-2 by FDA under an Emergency Use Authorization (EUA).  This EUA will remain in effect (meaning this test can be used) for the duration of the COVID-19 declaration under Section 564(b)(1) of the Act, 21 U.S.C. section 360bbb-3(b)(1), unless the authorization is terminated or revoked sooner.  Performed at [facility], [address] [place], [place] [address]      Labs: BNP (last 3 results) No results for input(s): BNP in the last 8760 hours. Basic Metabolic Panel: Recent Labs  Lab 09/18/19 1409  NA 132*  K 4.3  CL 96*  CO2 22  GLUCOSE 276*  BUN 19  CREATININE 1.01  CALCIUM 10.0   Liver Function Tests: No results for input(s): AST, ALT, ALKPHOS, BILITOT, PROT, ALBUMIN in the last 168 hours. No results for input(s): LIPASE, AMYLASE in the last 168 hours. No results for input(s): AMMONIA in the last 168 hours. CBC: Recent Labs  Lab  09/18/19 1409  WBC 9.2  HGB 15.3  HCT 45.6  MCV 82.8  PLT 261   Cardiac Enzymes: No results for input(s): CKTOTAL, CKMB, CKMBINDEX, TROPONINI in the last 168 hours. BNP: Invalid input(s): POCBNP CBG: No results for input(s): GLUCAP in the last 168 hours. D-Dimer No results for input(s): DDIMER in the last 72 hours. Hgb A1c Recent Labs    09/19/19 0619  HGBA1C 9.0*   Lipid Profile Recent Labs    09/19/19 0619  CHOL 352*  HDL 32*  LDLCALC UNABLE TO CALCULATE IF TRIGLYCERIDE OVER 400 mg/dL  TRIG 604*  CHOLHDL 54.0  LDLDIRECT 214.0*   Thyroid function studies Recent Labs    09/19/19 0929  TSH 1.728   Anemia work up Recent Labs    09/19/19 0619 09/19/19 0929  VITAMINB12 436 487   Urinalysis    Component Value Date/Time   COLORURINE YELLOW 09/18/2019 1406   APPEARANCEUR CLEAR 09/18/2019 1406   LABSPEC 1.015 09/18/2019 1406   PHURINE 6.0 09/18/2019 1406   GLUCOSEU >=500 (A) 09/18/2019 1406   HGBUR NEGATIVE 09/18/2019 1406   BILIRUBINUR NEGATIVE 09/18/2019 1406   KETONESUR NEGATIVE 09/18/2019 1406   PROTEINUR NEGATIVE 09/18/2019 1406   NITRITE NEGATIVE 09/18/2019 1406   LEUKOCYTESUR NEGATIVE 09/18/2019  1406   Sepsis Labs Invalid input(s): PROCALCITONIN,  WBC,  LACTICIDVEN Microbiology Recent Results (from the past 240 hour(s))  SARS Coronavirus 2 by RT PCR (hospital order, [facility]) Nasopharyngeal Nasopharyngeal Swab     Status: None   Collection Time: 09/18/19  5:23 PM   Specimen: Nasopharyngeal Swab  Result Value Ref Range Status   SARS Coronavirus 2 NEGATIVE NEGATIVE Final    Comment: (NOTE) SARS-CoV-2 target nucleic acids are NOT DETECTED.  The SARS-CoV-2 RNA is generally detectable in upper and lower respiratory specimens during the acute phase of infection. The lowest concentration of SARS-CoV-2 viral copies this assay can detect is 250 copies / mL. A negative result does not preclude SARS-CoV-2 infection and  should not be used as the sole basis for treatment or other patient management decisions.  A negative result may occur with improper specimen collection / handling, submission of specimen other than nasopharyngeal swab, presence of viral mutation(s) within the areas targeted by this assay, and inadequate number of viral copies (<250 copies / mL). A negative result must be combined with clinical observations, patient history, and epidemiological information.  Fact Sheet for Patients:   BoilerBrush.com.cyhttps://www.fda.gov/media/136312/download  Fact Sheet for Healthcare Providers: https://pope.com/https://www.fda.gov/media/136313/download  This test is not yet approved or  cleared by the [place] FDA and has been authorized for detection and/or diagnosis of SARS-CoV-2 by FDA under an Emergency Use Authorization (EUA).  This EUA will remain in effect (meaning this test can be used) for the duration of the COVID-19 declaration under Section 564(b)(1) of the Act, 21 U.S.C. section 360bbb-3(b)(1), unless the authorization is terminated or revoked sooner.  Performed at [facility], [address] [place], [place] [id number]      Time coordinating discharge: 40 minutes  SIGNED:   Alba CoryBelkys A Elza Sortor, MD  [facility]

## 2019-09-19 NOTE — Progress Notes (Signed)
STROKE TEAM PROGRESS NOTE   INTERVAL HISTORY Wife and OT at bedside. Pt sitting in the edge of bed, anxious to go home sleep. He has PTSD and has to sleep everyday. He did not get good sleep here last night and he has to go home today. He has hx of HTN and OSA on CPAP. He has now newly diagnosed DM and HLD. Now on DPAT and statin. He refused TEE and will do TCD bubble study before discharge.   Vitals:   09/18/19 2100 09/18/19 2223 09/19/19 0018 09/19/19 0332  BP: (!) 144/92 (!) 134/92 (!) 148/88 (!) 156/104  Pulse: 99 100 93 92  Resp: 19 18 18 20   Temp:  98.5 F (36.9 C) 98.1 F (36.7 C) 98 F (36.7 C)  TempSrc:  Oral Oral Oral  SpO2: 97% 97% 97% 96%  Weight:  (!) 168.4 kg    Height:  5\' 11"  (1.803 m)     CBC:  Recent Labs  Lab 09/18/19 1409  WBC 9.2  HGB 15.3  HCT 45.6  MCV 82.8  PLT 261   Basic Metabolic Panel:  Recent Labs  Lab 09/18/19 1409  NA 132*  K 4.3  CL 96*  CO2 22  GLUCOSE 276*  BUN 19  CREATININE 1.01  CALCIUM 10.0   Lipid Panel:  Recent Labs  Lab 09/19/19 0619  CHOL 352*  TRIG 537*  HDL 32*  CHOLHDL 11.0  VLDL UNABLE TO CALCULATE IF TRIGLYCERIDE OVER 400 mg/dL  LDLCALC UNABLE TO CALCULATE IF TRIGLYCERIDE OVER 400 mg/dL   [id number]:  Recent Labs  Lab 09/19/19 0619  HGBA1C 9.0*   Urine Drug Screen: No results for input(s): LABOPIA, COCAINSCRNUR, LABBENZ, AMPHETMU, THCU, LABBARB in the last 168 hours.  Alcohol Level No results for input(s): ETH in the last 168 hours.  IMAGING past 24 hours CT Angio Head W or Wo Contrast  Result Date: 09/18/2019 CLINICAL DATA:  Confusion and speech disturbance last night. EXAM: CT ANGIOGRAPHY HEAD TECHNIQUE: Multidetector CT imaging of the head was performed using the standard protocol during bolus administration of intravenous contrast. Multiplanar CT image reconstructions and MIPs were obtained to evaluate the vascular anatomy. CONTRAST:  100mL OMNIPAQUE IOHEXOL 350 MG/ML SOLN COMPARISON:  None. FINDINGS: CT  HEAD Brain: The brainstem and cerebellum are normal. There is a 5-6 cm region of low-density in the left temporoparietal junction region most consistent with acute infarction. Mass lesion is possible but not favored. No sign of hemorrhage. No midline shift. No other brain abnormality. No hydrocephalus or extra-axial collection. Vascular: Negative on the standard head CT. Skull: Normal Sinuses: Clear Orbits: Normal CTA HEAD Anterior circulation: Both internal carotid arteries are widely patent through the skull base and siphon regions. The anterior and middle cerebral vessels are patent without proximal stenosis, aneurysm or vascular malformation. I cannot identify an occluded middle cerebral artery branch. Posterior circulation: Both vertebral arteries widely patent to the basilar. No basilar stenosis. Posterior circulation branch vessels are patent. Left posterior cerebral artery receives most of it supply from the anterior circulation. Venous sinuses: Patent and normal. Anatomic variants: None significant. IMPRESSION: 1. 5-6 cm region of low-density in the left temporoparietal junction region most consistent with acute infarction. Mass lesion is possible but not favored. No sign of hemorrhage. 2. No intracranial large or medium vessel occlusion at this time, or correctable proximal stenosis. I cannot identify an occluded left middle cerebral artery branch. Electronically Signed   By: Paulina FusiMark  Shogry M.D.   On: 09/18/2019 17:37  MR ANGIO HEAD WO CONTRAST  Result Date: 09/19/2019 CLINICAL DATA:  Initial evaluation for acute neuro deficit, stroke suspected. Left orbital headache. EXAM: MRA HEAD WITHOUT CONTRAST TECHNIQUE: Angiographic images of the Circle of Willis were obtained using MRA technique without intravenous contrast. COMPARISON:  Prior CTA from 09/18/2019. FINDINGS: ANTERIOR CIRCULATION: Visualized distal cervical segments of the internal carotid arteries are widely patent with symmetric antegrade flow.  Petrous, cavernous, and supraclinoid ICAs widely patent without stenosis or other abnormality. Ophthalmic arteries patent proximally. ICA termini well perfused. A1 segments patent bilaterally. Normal anterior communicating artery complex. Anterior cerebral arteries widely patent to their distal aspects without stenosis. No M1 stenosis or occlusion. Normal MCA bifurcations. Distal MCA branches well perfused and symmetric. No visible left MCA branch occlusion. POSTERIOR CIRCULATION: Vertebral arteries patent to the vertebrobasilar junction without stenosis. Left vertebral artery slightly dominant. Neither PICA well visualized. Basilar patent to its distal aspect without stenosis. Superior cerebral arteries patent bilaterally. Right PCA supplied primarily via the basilar. Left PCA supplied via a hypoplastic left P1 segment and prominent left posterior communicating artery. Both PCAs well perfused to their distal aspects without stenosis. No intracranial aneurysm or other vascular abnormality. IMPRESSION: Normal intracranial MRA. No large vessel occlusion, hemodynamically significant stenosis, or other vascular abnormality. No aneurysm. Electronically Signed   By: Rise Mu M.D.   On: 09/19/2019 03:45   MR BRAIN WO CONTRAST  Result Date: 09/19/2019 CLINICAL DATA:  Follow-up examination for acute stroke. EXAM: MRI HEAD WITHOUT CONTRAST TECHNIQUE: Multiplanar, multiecho pulse sequences of the brain and surrounding structures were obtained without intravenous contrast. COMPARISON:  Prior CTA from 09/18/2019. FINDINGS: Brain: Moderate size confluent area of restricted diffusion involving the left temporal occipital region again seen, consistent with posterior left MCA territory infarct, corresponding with abnormality on prior CT. Overall size and distribution is stable. Associated mild petechial hemorrhage without frank hemorrhagic transformation or significant regional mass effect. Remainder the brain is  normal in appearance. No other focal parenchymal signal abnormality. No other evidence for acute or chronic ischemia. No other foci of susceptibility artifact to suggest acute or chronic intracranial hemorrhage. No mass lesion, midline shift or mass effect. No hydrocephalus or extra-axial fluid collection. Pituitary gland suprasellar region normal. Midline structures intact. Vascular: Major intracranial vascular flow voids are well maintained and normal. Skull and upper cervical spine: Craniocervical junction within normal limits. Bone marrow signal intensity normal. No scalp soft tissue abnormality. Sinuses/Orbits: Globes and orbital soft tissues within normal limits. Paranasal sinuses are largely clear. No significant mastoid effusion. Inner ear structures grossly normal. Other: None. IMPRESSION: 1. Moderate-sized acute ischemic left MCA territory infarct involving the left temporoccipital region, posterior left MCA distribution. Associated mild petechial hemorrhage without frank hemorrhagic transformation or significant regional mass effect. 2. Otherwise normal brain MRI. Electronically Signed   By: Rise Mu M.D.   On: 09/19/2019 03:58   MR Venogram Head  Result Date: 09/19/2019 CLINICAL DATA:  Initial evaluation for possible dural sinus thrombosis. EXAM: MR VENOGRAM OF THE HEAD WITHOUT CONTRAST TECHNIQUE: Angiographic images of the intracranial venous structures were obtained using MRV technique without intravenous contrast. COMPARISON:  Prior CTA from 09/18/2019. FINDINGS: Normal flow related signal seen throughout the superior sagittal sinus to the level of the torcula. Torcula itself is patent. Right transverse and sigmoid sinuses are widely patent as is the visualized proximal right internal jugular vein. Left transverse sinus hypoplastic and not well seen proximally, but appears widely patent and normal distal to the vein of Labbe. Left  sigmoid sinus is patent as is the visualized proximal  left internal jugular vein. Straight sinus, vein of Galen, internal cerebral veins and basal veins of Rosenthal are patent. No evidence for dural sinus thrombosis. No dural sinus stenosis or other abnormality. IMPRESSION: Normal intracranial MRV. No evidence for dural sinus thrombosis. Electronically Signed   By: Rise Mu M.D.   On: 09/19/2019 03:49    PHYSICAL EXAM  Temp:  [98 F (36.7 C)-99.3 F (37.4 C)] 98.8 F (37.1 C) (08/26 0935) Pulse Rate:  [92-113] 99 (08/26 0935) Resp:  [12-29] 20 (08/26 0935) BP: (134-165)/(83-106) 149/83 (08/26 0935) SpO2:  [95 %-99 %] 99 % (08/26 0935) Weight:  [167.8 kg-168.4 kg] 168.4 kg (08/25 2223)  General - morbid obesity, well developed, in no apparent distress.  Ophthalmologic - fundi not visualized due to noncooperation.  Cardiovascular - Regular rhythm and rate.  Mental Status -  Level of arousal and orientation to time, place, and person were intact, but anxious. Language exam showed fairly fluent speech, however with intermittent incoherence and word salad. He follows most simple commands, but intermittently not able to comprehend commands. Anomia and difficulty repeat less meaningful sentences.   Cranial Nerves II - XII - II - Visual field intact OU. III, IV, VI - Extraocular movements intact. V - Facial sensation intact bilaterally. VII - Facial movement intact bilaterally. VIII - Hearing & vestibular intact bilaterally. X - Palate elevates symmetrically. XI - Chin turning & shoulder shrug intact bilaterally. XII - Tongue protrusion intact.  Motor Strength - The patient's strength was normal in all extremities and pronator drift was absent.  Bulk was normal and fasciculations were absent.   Motor Tone - Muscle tone was assessed at the neck and appendages and was normal.  Reflexes - The patient's reflexes were symmetrical in all extremities and he had no pathological reflexes.  Sensory - Light touch, temperature/pinprick  were assessed and were symmetrical.    Coordination - The patient had normal movements in the hands with no ataxia or dysmetria.  Tremor was absent.  Gait and Station - deferred.   ASSESSMENT/PLAN Bruce Cross is a 29 y.o. male with history of HTN, PTSD/OCD, GERD presenting to [facility] with word finding difficulties, HA L side.   Stroke: L MCA infarct, embolic pattern, etiology uncertain but concerning for uncontrolled underlying risk factors - newly diagnosed DM, HLD, hx of HTN and OSA and morbid obesity  CTA head L temporoparietal  jxn low-density c/w infarct. No LVO or significant stenosis   MRI  Moderate L MCA infarct (L temporoparietal, posterior L MCA) w/ mild petechial hemorrhage   MRA  Unremarkable   MRV Unremarkable    CTA neck negative  2D Echo EF 60-65%. No source of embolus   Pt refused TEE to look for embolic source    Transcranial Doppler w/ bubbles possible moderate sized PFO (significant interference with signal) - need outpt TEE to confirm.   LDL UTC w/ TG 537, direct LDL 214  HgbA1c 9.0  Hypercoagulable and vasculitic labs pending   VTE prophylaxis - SCDs   No antithrombotic prior to admission, now on DAPT x 3 weeks then aspirin alone     Therapy recommendations:  outpt PT/OT/speech  Disposition:  Return home  Hypertension  Home meds:  Lisinopril 20  Stable, highest 165/105 now 150s . Permissive hypertension (OK if < 220/120) but gradually normalize in 2-3 days . Long-term BP goal normotensive  Hyperlipidemia, new dx  Home meds:  No statin  Now on lipitor 80  LDL UTC w/ TG 537, direct LDL 214, goal < 70  Continue statin at discharge  Diabetes type II, Uncontrolled, new dx  HgbA1c 9.0, goal < 7.0  CBGs  SSI  Put on metformine  Follow up with PCP closely for DM control  ? PFO  Transcranial Doppler w/ bubbles possible moderate sized PFO (significant interference with signal)   Pt refused inpt TEE  Need  outpt TEE to confirm  On DAPT  Other Stroke Risk Factors  Hx ETOH use, advised to drink no more than 2 drink(s) a day  Morbid Obesity, Body mass index is 51.78 kg/m., recommend weight loss, diet and exercise as appropriate   OSA on CPAP  Other Active Problems  PTSD, OCD on riluzole, abilify, luvox, pentasa, ritalin PTA  Mother w/ hx lupus (SLE)  GERD  Hospital day # 0  Neurology will sign off. Please call with questions. Pt will follow up with stroke clinic Dr. Pearlean Brownie at [facility] in about 2-3 weeks. Thanks for the consult.  Marvel Plan, MD PhD Stroke Neurology 09/19/2019 12:07 PM   To contact Stroke Continuity provider, please refer to [email or web address]. After hours, contact General Neurology

## 2019-09-19 NOTE — Progress Notes (Signed)
TCD bubble study has been completed.   Preliminary results in CV Proc.   Blanch Media 09/19/2019 2:36 PM

## 2019-09-19 NOTE — Progress Notes (Signed)
Patient being discharged home.  Patient to be discharged by his wife.  IV removed with the catheter intact.  Discharge instructions and prescription information given to the patent who verbalized understanding.

## 2019-09-19 NOTE — TOC Transition Note (Signed)
Transition of Care [facility]) - CM/SW Discharge Note   Patient Details  Name: Bruce Cross MRN: [id number] Date of Birth: February 22, 1990  Transition of Care [facility]) CM/SW Contact:  Baldemar Lenis, LCSW Phone Number: 09/19/2019, 3:20 PM   Clinical Narrative:   Patient discharging home today. CSW arranged outpatient PT, OT, and SLP at [facility]. No other needs identified.    Final next level of care: OP Rehab Barriers to Discharge: Barriers Resolved   Patient Goals and CMS Choice Patient states their goals for this hospitalization and ongoing recovery are:: to get back home ASAP CMS Medicare.gov Compare Post Acute Care list provided to:: Patient Choice offered to / list presented to : Patient  Discharge Placement                       Discharge Plan and Services                                     Social Determinants of Health (SDOH) Interventions     Readmission Risk Interventions No flowsheet data found.

## 2019-09-19 NOTE — Anesthesia Preprocedure Evaluation (Deleted)
Anesthesia Evaluation    Reviewed: Allergy & Precautions, Patient's Chart, lab work & pertinent test results  Airway        Dental   Pulmonary neg pulmonary ROS,           Cardiovascular hypertension, Pt. on medications      Neuro/Psych PSYCHIATRIC DISORDERS Anxiety  left temporoparietal junction CVA CVA, Residual Symptoms    GI/Hepatic Neg liver ROS, GERD  ,  Endo/Other  Morbid obesityBMI 52  Renal/GU negative Renal ROS  negative genitourinary   Musculoskeletal negative musculoskeletal ROS (+)   Abdominal   Peds  Hematology negative hematology ROS (+)   Anesthesia Other Findings   Reproductive/Obstetrics negative OB ROS                             Anesthesia Physical Anesthesia Plan  ASA: III  Anesthesia Plan: MAC   Post-op Pain Management:    Induction:   PONV Risk Score and Plan: 1 and Treatment may vary due to age or medical condition, Propofol infusion and TIVA  Airway Management Planned: Natural Airway and Nasal Cannula  Additional Equipment: None  Intra-op Plan:   Post-operative Plan:   Informed Consent:   Plan Discussed with:   Anesthesia Plan Comments:         Anesthesia Quick Evaluation

## 2019-09-19 NOTE — Progress Notes (Signed)
Returned to room after MRI.

## 2019-09-19 NOTE — Plan of Care (Signed)
  Problem: Education: Goal: Knowledge of disease or condition will improve Outcome: Progressing   
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
## 2019-09-19 NOTE — Progress Notes (Signed)
Received to room 3W26 from [facility] via EMS. Assisted to bed and positioned for comfort. Oriented to room, bed and unit. In no acute distress.

## 2019-09-19 NOTE — Progress Notes (Signed)
PT Cancellation Note  Patient Details Name: Bruce Cross MRN: [id number] DOB: 05/02/1990   Cancelled Treatment:    Reason Eval/Treat Not Completed: PT screened, no needs identified, will sign off. Patient up at sink standing brushing teeth upon arrival to room. Reports no difficulties getting around. No needs at this time. Please re-consult if needs change.     Natalio Salois 09/19/2019, 1:32 PM

## 2019-09-19 NOTE — H&P (Signed)
History and Physical    Bruce Cross YFV:[id number] DOB: [birth year] DOA: 09/18/2019  PCP: Wendall Mola, MD  Patient coming from: Home.  I have personally briefly reviewed patient's old medical records in [facility]  Chief Complaint: Difficulty talking/finding words.  HPI: Bruce Cross is a 29 y.o. 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  He reports that he started feeling very hot and nearly passed out.  One of his colleagues helped him to sit down, but also noticed that he was having trouble talking.  He seemed to be better after he cooled off and went home.  He also mentioned when his wife got home she also noticed that he was having difficulty putting sentences together.  Today in the morning he was attempting to read a book to his trial and was unable to read the words right.  He has had left temporal headache since yesterday.  However, he has not had blurred vision, focal weakness or numbness.  He denies chest pain, palpitations, diaphoresis, PND, orthopnea or pitting edema of the lower extremities.  No abdominal pain, nausea, vomiting, diarrhea, constipation, melena or hematochezia.  Denies dysuria, frequency or nocturia.  No polyuria, polydipsia, polyphagia or blurred vision.  ED Course: Initial vital signs were temperature 99.3 F, pulse 113, respirations 21, blood pressure 145/89 mmHg O2 sat 99% on room air.  His urinalysis showed glucosuria more than 500 mg/dL and many bacteria on microscopic examination, but all other values were within expected range.  CBC was normal.  Sodium is 132, chloride 96 which normalized once corrected to hyperglycemia of 276 mg/dL.  The rest of the  BMP values are unremarkable.  Imaging: CTA head with and without contrast show a 5 to 6 cm region of low density in the left temporoparietal junction most consistent with acute infarction.  Mass lesion is a possibility, but no [place].  There is no sign of hemorrhage.  No intracranial large or medium vessel occlusion at this time.  There were no correctable proximal stenosis.    Review of Systems: As per HPI otherwise all other systems reviewed and are negative.  Past Medical History:  Diagnosis Date  . GERD (gastroesophageal reflux disease)   . Hypertension   . OCD (obsessive compulsive disorder)    History reviewed. No pertinent surgical history.  Social History  reports that he has never smoked. He has never used smokeless tobacco. He reports previous alcohol use. He reports that he does not use drugs.  Not on File  Family History  Problem Relation Age of Onset  . Lupus Mother    Prior to Admission medications   Medication Sig Start Date End Date Taking? Authorizing Provider  ARIPiprazole (ABILIFY) 15 MG tablet Take 1 tablet by mouth daily. 09/08/19  Yes [provider]  fluvoxaMINE (LUVOX) 100 MG tablet 300 mg. 09/05/19  Yes [provider]  mesalamine (PENTASA) 250 MG CR capsule Take 2,000 mg by mouth 2 (two) times daily.   Yes [provider]  riluzole (RILUTEK) 50 MG tablet 100 mg. 09/08/19  Yes [provider]  lisinopril (ZESTRIL) 20 MG tablet Take 20 mg by mouth 2 (two) times daily.    [provider]  methylphenidate (RITALIN  LA) 40 MG 24 hr capsule Take 120 mg by mouth daily. 07/26/19   [provider]   Physical Exam: Vitals:   09/18/19 2030 09/18/19 2100 09/18/19 2223 09/19/19 0018  BP: 139/87 (!) 144/92 (!) 134/92 (!) 148/88  Pulse: 96 99 100 93  Resp: 19 19 18 18   Temp:   98.5 F (36.9 C) 98.1 F (36.7 C)  TempSrc:   Oral Oral  SpO2: 98% 97% 97% 97%  Weight:   (!) 168.4 kg   Height:   5\' 11"  (1.803 m)     Constitutional: NAD, calm, comfortable Eyes: PERRL, lids and conjunctivae normal ENMT: Mucous membranes are moist. Posterior pharynx clear of any exudate or lesions. Neck: normal, supple, no masses, no thyromegaly. Respiratory: clear to auscultation bilaterally, no wheezing, no crackles. Normal respiratory effort. No accessory muscle use.  Cardiovascular: Regular rate and rhythm, no murmurs / rubs / gallops. No extremity edema. 2+ pedal pulses. No carotid bruits.  Abdomen: Obese, nondistended.  BS positive.  Soft, no tenderness, no masses palpated. No hepatosplenomegaly. Musculoskeletal: no clubbing / cyanosis. Good ROM, no contractures. Normal muscle tone.  Skin: no rashes, lesions, ulcers  Neurologic: CN 2-12 grossly intact. Sensation intact, DTR normal. Strength 5/5 in all 4.  Psychiatric: Normal judgment and insight. Alert and oriented x 3. Normal mood.   Labs on Admission: I have personally reviewed following labs and imaging studies  CBC: Recent Labs  Lab 09/18/19 1409  WBC 9.2  HGB 15.3  HCT 45.6  MCV 82.8  PLT 261    Basic Metabolic Panel: Recent Labs  Lab 09/18/19 1409  NA 132*  K 4.3  CL 96*  CO2 22  GLUCOSE 276*  BUN 19  CREATININE 1.01  CALCIUM 10.0   GFR: Estimated Creatinine Clearance: 168.6 mL/min (by C-G formula based on SCr of 1.01 mg/dL).  Liver Function Tests: No results for input(s): AST, ALT, ALKPHOS, BILITOT, PROT, ALBUMIN in the last 168 hours.  Urine analysis:    Component Value Date/Time   COLORURINE YELLOW 09/18/2019 1406   APPEARANCEUR CLEAR 09/18/2019 1406   LABSPEC 1.015 09/18/2019 1406   PHURINE 6.0 09/18/2019 1406   GLUCOSEU >=500 (A) 09/18/2019 1406   HGBUR NEGATIVE 09/18/2019 1406   BILIRUBINUR NEGATIVE 09/18/2019 1406   KETONESUR NEGATIVE 09/18/2019 1406   PROTEINUR NEGATIVE 09/18/2019 1406   NITRITE NEGATIVE 09/18/2019 1406   LEUKOCYTESUR NEGATIVE 09/18/2019 1406   Radiological Exams on Admission: CT Angio Head W or Wo  Contrast  Result Date: 09/18/2019 CLINICAL DATA:  Confusion and speech disturbance last night. EXAM: CT ANGIOGRAPHY HEAD TECHNIQUE: Multidetector CT imaging of the head was performed using the standard protocol during bolus administration of intravenous contrast. Multiplanar CT image reconstructions and MIPs were obtained to evaluate the vascular anatomy. CONTRAST:  09/20/2019 OMNIPAQUE IOHEXOL 350 MG/ML SOLN COMPARISON:  None. FINDINGS: CT HEAD Brain: The brainstem and cerebellum are normal. There is a 5-6 cm region of low-density in the left temporoparietal junction region most consistent with acute infarction. Mass lesion is possible but not favored. No sign of hemorrhage. No midline shift. No other brain abnormality. No hydrocephalus or extra-axial collection. Vascular: Negative on the standard head CT. Skull: Normal Sinuses: Clear Orbits: Normal CTA HEAD Anterior circulation: Both internal carotid arteries are widely patent through the skull base and siphon regions. The anterior and middle cerebral vessels are patent without proximal stenosis, aneurysm or vascular malformation. I cannot identify an occluded middle cerebral artery branch. Posterior circulation: Both vertebral arteries widely patent  to the basilar. No basilar stenosis. Posterior circulation branch vessels are patent. Left posterior cerebral artery receives most of it supply from the anterior circulation. Venous sinuses: Patent and normal. Anatomic variants: None significant. IMPRESSION: 1. 5-6 cm region of low-density in the left temporoparietal junction region most consistent with acute infarction. Mass lesion is possible but not favored. No sign of hemorrhage. 2. No intracranial large or medium vessel occlusion at this time, or correctable proximal stenosis. I cannot identify an occluded left middle cerebral artery branch. Electronically Signed   By: Paulina Fusi M.D.   On: 09/18/2019 17:37    EKG: Independently reviewed. Sinus  tachycardia Otherwise normal ECG Vent. rate 111 BPM PR interval 136 ms QRS duration 86 ms QT/QTc 330/448 ms P-R-T axes 62 81 42  Assessment/Plan Principal Problem:   Stroke (HCC) Observation/telemetry. Frequent neuro checks. PT/OT/SLP. Check fasting lipids. Check hemoglobin A1c. Check hypercoagulable work-up. Check MRI of brain with and without contrast. MRA head without contrast. MRA neck with and without contrast. MRV head to rule out dural venous sinus thrombosis. Neurology consultation appreciated.  Active Problems:   Hyperglycemia with glucosuria Carbohydrate modified diet. CBG monitoring with RI SS. Check hemoglobin A1c in the a.m.    Obesity, morbid, BMI 50 or higher (HCC) Needs significant lifestyle modifications.    Hypertension Hold lisinopril. Monitor blood pressure. Allow permissive hypertension up to 220/120 mmHg.    GERD (gastroesophageal reflux disease) Famotidine 20 mg p.o. twice daily.    OCD (obsessive compulsive disorder) Continue aripiprazole, fluvoxamine riluzole and methylphenidate after med reconciliation performed.    DVT prophylaxis: SCDs. Code Status:   Full code. Family Communication: Disposition Plan:   Patient is from:  Home via [facility] transfer.  Anticipated DC to:  Home.  Anticipated DC date:  09/20/2019.  Anticipated DC barriers: Clinical status.   Consults called: Neuro hospitalist team is following.  Admission status:  Observation/telemetry.    Severity of Illness: High due to aphasic symptoms with suspicious imaging on CT brain requiring full CVA work-up.  Bobette Mo MD [facility]  How to contact the [facility] Attending or Consulting provider 7A - 7P or covering provider during after hours 7P -7A, for this patient?   1. Check the care team in [facility] and look for a) attending/consulting TR[facility] provider listed and b) the [facility] team listed 2. Log into [email or web address] and use [facility]'s universal password to access. If you  do not have the password, please contact the hospital operator. 3. Locate the [facility] provider you are looking for under [facility] and page to a number that you can be directly reached. 4. If you still have difficulty reaching the provider, please page the [facility] (Director on Call) for the Hospitalists listed on amion for assistance.  09/19/2019, 2:00 AM   This document was prepared using Dragon voice recognition software and may contain some unintended transcription errors.

## 2019-09-19 NOTE — Progress Notes (Signed)
Occupational Therapy Progress Note  Pt able to perform mod complex path finding without cues.   Recommend OPOT.  No DME recommended.    09/19/19 1200  OT Visit Information  Last OT Received On 09/19/19  Assistance Needed +1  History of Present Illness This 29 y.o. male admitted with difficulty speaking and Lt temporal HA.  MRI of brain showed moderate sized acute ischemic Lt MCA territory infarct involving Lt temporoccipital region, associated mild petechial hemorrhage without frank hemorrhagic transformation or significant regional mass effect.  PMH includes: HTN, OCD, PTSD, morbid obesity.    Precautions  Precautions None  Precaution Comments pt with increased anxiety   Pain Assessment  Pain Assessment No/denies pain  Cognition  Arousal/Alertness Awake/alert  Behavior During Therapy WFL for tasks assessed/performed  Overall Cognitive Status Within Functional Limits for tasks assessed  General Comments WFL for basic cognition.  He was able to perform mod complex path finding without cues   Upper Extremity Assessment  Upper Extremity Assessment Overall WFL for tasks assessed  Lower Extremity Assessment  Lower Extremity Assessment Overall WFL for tasks assessed  Balance  Overall balance assessment No apparent balance deficits (not formally assessed)  Transfers  Overall transfer level Independent  OT - End of Session  Activity Tolerance Patient tolerated treatment well  Patient left in bed;with call bell/phone within reach;Other (comment)  Nurse Communication Mobility status  OT Assessment/Plan  OT Plan Discharge plan remains appropriate  OT Visit Diagnosis Cognitive communication deficit (R41.841)  Symptoms and signs involving cognitive functions Cerebral infarction  OT Frequency (ACUTE ONLY) Min 2X/week  Follow Up Recommendations Outpatient OT;Supervision/Assistance - 24 hour  AM-PAC OT "6 Clicks" Daily Activity Outcome Measure (Version 2)  Help from another person eating  meals? 4  Help from another person taking care of personal grooming? 4  Help from another person toileting, which includes using toliet, bedpan, or urinal? 4  Help from another person bathing (including washing, rinsing, drying)? 4  Help from another person to put on and taking off regular upper body clothing? 4  Help from another person to put on and taking off regular lower body clothing? 4  6 Click Score 24  OT Goal Progression  Progress towards OT goals Progressing toward goals  OT Time Calculation  OT Start Time (ACUTE ONLY) 1213  OT Stop Time (ACUTE ONLY) 1221  OT Time Calculation (min) 8 min  OT General Charges  $OT Visit 1 Visit  OT Treatments  $Therapeutic Activity 8-22 mins  Eber Jones., OTR/L Acute Rehabilitation Services Pager [phone] Office [phone]

## 2019-09-19 NOTE — Interval H&P Note (Signed)
History and Physical Interval Note:  09/19/2019 10:29 AM  Bruce Cross  has presented today for surgery, with the diagnosis of STROKE.  The various methods of treatment have been discussed with the patient and family. After consideration of risks, benefits and other options for treatment, the patient has consented to  Procedure(s): TRANSESOPHAGEAL ECHOCARDIOGRAM (TEE) (N/A) as a surgical intervention.  The patient's history has been reviewed, patient examined, no change in status, stable for surgery.  I have reviewed the patient's chart and labs.  Questions were answered to the patient's satisfaction.     Kristeen Miss

## 2019-09-19 NOTE — Progress Notes (Signed)
OT Cancellation Note  Patient Details Name: Bruce Cross MRN: [id number] DOB: [birth year]   Cancelled Treatment:    Reason Eval/Treat Not Completed: Patient at procedure or test/ unavailable.  Will Reattempt  Eber Jones., OTR/L Acute Rehabilitation Services Pager [phone] Office [phone]   Jeani Hawking M 09/19/2019, 9:00 AM
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
## 2019-09-19 NOTE — Plan of Care (Signed)
  Problem: Education: Goal: Knowledge of disease or condition will improve 09/19/2019 1425 by Dallas Breeding, RN Outcome: Adequate for Discharge 09/19/2019 1209 by Dallas Breeding, RN Outcome: Progressing 09/19/2019 1024 by Dallas Breeding, RN Outcome: Progressing   Problem: Education: Goal: Knowledge of disease or condition will improve 09/19/2019 1425 by Dallas Breeding, RN Outcome: Adequate for Discharge 09/19/2019 1209 by Dallas Breeding, RN Outcome: Progressing 09/19/2019 1024 by Dallas Breeding, RN Outcome: Progressing

## 2019-09-19 NOTE — Progress Notes (Signed)
Dr Arora in to see patient 

## 2019-09-19 NOTE — Progress Notes (Signed)
    Spoke with patient regarding TEE that is scheduled for today at 12pm and Bruce Cross seems very overwhelmed at this time and wishes to speak with primary team once again before proceeding. He is asking if the procedure can be performed in the OP setting however I explained that it will be more convenient and helpful to have the information provided by the TEE while he is here in the hospital. RN at bedside who will contact primary team. Will be glad to come by and explain risks and benefits if he is willing to proceed with procedure today.    [name] NP-C [facility] Pager: [phone]

## 2019-09-19 NOTE — Progress Notes (Signed)
  Echocardiogram 2D Echocardiogram has been performed.  Nathalya Wolanski G Fifi Schindler 09/19/2019, 10:20 AM

## 2019-09-19 NOTE — Evaluation (Signed)
Speech Language Pathology Evaluation Patient Details Name: Bruce Cross MRN: [id number] DOB: 1991/01/04 Today's Date: 09/19/2019 Time: 1150-1207 SLP Time Calculation (min) (ACUTE ONLY): 17 min  Problem List:  Patient Active Problem List   Diagnosis Date Noted  . Hyperglycemia 09/19/2019  . Obesity, morbid, BMI 50 or higher (HCC) 09/19/2019  . Hypertension   . GERD (gastroesophageal reflux disease)   . OCD (obsessive compulsive disorder)   . Stroke [facility]) 09/18/2019   Past Medical History:  Past Medical History:  Diagnosis Date  . GERD (gastroesophageal reflux disease)   . Hypertension   . OCD (obsessive compulsive disorder)    Past Surgical History: History reviewed. No pertinent surgical history. HPI:  Pt is a 29 y.o. male with history of hypertension, GERD, morbid obesity, and OCD. Presented to ED for evaluation of word finding difficulty. He reports that yesterday he started to feel very hot and had near syncope. Pt has demonstrated word finding difficulty since.    Assessment / Plan / Recommendation Clinical Impression  Pt seen after a fatiguing morning; several providers had just come out of room in succession and pt had not yet eaten a meal. Pt demonstrates aphasia with moderate receptive and expressive impairment. Pt is able to comprehend  basic conversation, but has poor accuracy with receptive language tasks with increasing complexity (muti-step/complex commands/complex Yes no questions). Pts expressive language was fluent and grammatical, but littered with circumlocutions and paraphasias given word finding impairment. Pt named 0/5 pictures; phonemic cues, semantic cues only minimally effective, though again pt very frusterated and fatigued. Pt was unable to read a nursery rhyme, but sang it with one break down in one phrase out of 6. Offered basic compensatory strategies in writing to pts wife and discussed early principles of speech rehabilitation. Pt would benefit from OP  SLP therapy.     SLP Assessment  SLP Recommendation/Assessment: All further Speech Lanaguage Pathology  needs can be addressed in the next venue of care SLP Visit Diagnosis: Aphasia (R47.01)    Follow Up Recommendations  Outpatient SLP    Frequency and Duration           SLP Evaluation Cognition  Overall Cognitive Status: Within Functional Limits for tasks assessed Orientation Level: Oriented X4       Comprehension  Auditory Comprehension Overall Auditory Comprehension: Impaired Yes/No Questions: Impaired Basic Biographical Questions: 76-100% accurate Basic Immediate Environment Questions: 75-100% accurate Complex Questions: 25-49% accurate Commands: Impaired One Step Basic Commands: 50-74% accurate Two Step Basic Commands: 0-24% accurate Multistep Basic Commands: 0-24% accurate Conversation: Simple Reading Comprehension Reading Status: Impaired Word level: Not tested Sentence Level: Not tested Paragraph Level: Impaired    Expression Verbal Expression Overall Verbal Expression: Impaired Initiation: No impairment Automatic Speech: Name;Social Response;Month of year Level of Generative/Spontaneous Verbalization: Conversation Repetition: No impairment Naming: Impairment Responsive: Not tested Confrontation: Impaired Convergent: Not tested Divergent: Not tested Verbal Errors: Phonemic paraphasias;Semantic paraphasias;Aware of errors;Perseveration Pragmatics: No impairment Written Expression Dominant Hand: Right   Oral / Motor  Motor Speech Overall Motor Speech: Appears within functional limits for tasks assessed   GO                   Bruce Ditty, MA CCC-SLP  Acute Rehabilitation Services Pager [phone] Office [phone]  Bruce Cross 09/19/2019, 12:49 PM

## 2019-09-20 LAB — HOMOCYSTEINE: Homocysteine: 11.5 umol/L (ref 0.0–14.5)

## 2019-09-20 LAB — LUPUS ANTICOAGULANT PANEL
DRVVT: 44.8 s (ref 0.0–47.0)
PTT Lupus Anticoagulant: 27.7 s (ref 0.0–51.9)

## 2019-09-20 LAB — ANTI-DNA ANTIBODY, DOUBLE-STRANDED: ds DNA Ab: <1 IU/mL (ref 0–9)

## 2019-09-20 LAB — PROTEIN C ACTIVITY: Protein C Activity: 173 % (ref 73–180)

## 2019-09-20 LAB — ANA W/REFLEX IF POSITIVE: Anti Nuclear Antibody (ANA): NEGATIVE

## 2019-09-20 LAB — BETA-2-GLYCOPROTEIN I ABS, IGG/M/A
Beta-2 Glyco I IgG: <9 GPI IgG units (ref 0–20)
Beta-2-Glycoprotein I IgA: <9 GPI IgA units (ref 0–25)
Beta-2-Glycoprotein I IgM: <9 GPI IgM units (ref 0–32)

## 2019-09-20 LAB — PROTEIN S, TOTAL: Protein S Ag, Total: 137 % (ref 60–150)

## 2019-09-20 LAB — PROTEIN S ACTIVITY: Protein S Activity: 149 % — ABNORMAL HIGH (ref 63–140)

## 2019-09-21 LAB — PROTEIN C, TOTAL: Protein C, Total: 156 % — ABNORMAL HIGH (ref 60–150)

## 2019-09-21 LAB — CARDIOLIPIN ANTIBODIES, IGG, IGM, IGA
Anticardiolipin IgA: <9 APL U/mL (ref 0–11)
Anticardiolipin IgG: <9 GPL U/mL (ref 0–14)
Anticardiolipin IgM: <9 MPL U/mL (ref 0–12)

## 2019-09-23 LAB — PROTHROMBIN GENE MUTATION

## 2019-09-23 LAB — FACTOR 5 LEIDEN

## 2019-10-15 ENCOUNTER — Ambulatory Visit (INDEPENDENT_AMBULATORY_CARE_PROVIDER_SITE_OTHER): Payer: 59 | Admitting: Neurology

## 2019-10-15 ENCOUNTER — Encounter: Payer: Self-pay | Admitting: Neurology

## 2019-10-15 ENCOUNTER — Other Ambulatory Visit: Payer: Self-pay

## 2019-10-15 VITALS — BP 140/80 | HR 112 | Ht 71.0 in | Wt 355.0 lb

## 2019-10-15 DIAGNOSIS — Q2112 Patent foramen ovale: Secondary | ICD-10-CM

## 2019-10-15 DIAGNOSIS — I639 Cerebral infarction, unspecified: Secondary | ICD-10-CM

## 2019-10-15 DIAGNOSIS — R4701 Aphasia: Secondary | ICD-10-CM | POA: Diagnosis not present

## 2019-10-15 DIAGNOSIS — Q211 Atrial septal defect: Secondary | ICD-10-CM

## 2019-10-15 NOTE — Progress Notes (Signed)
[facility] [address] [place]. [place] [address] [phone]       OFFICE CONSULT NOTE  Mr. Bruce Cross Date of Birth:  Nov 17, 1990 Medical Record Number:  [id number]   Referring MD: Marvel Plan Reason for Referral: Stroke HPI: Bruce Cross is a 29 year old Caucasian male seen today for initial office consultation visit for stroke.  Is accompanied by his wife.  History is obtained from them, review of electronic medical records and I personally reviewed available pertinent imaging films in PACS.  He has a past medical history of obesity, hypertension, PTSD who presented with sudden onset of speech and word finding difficulties on 09/17/2019.  He works as a Economist and started having trouble getting his words out.  His colleagues thought that he had overheated and provided him some fluids but since symptoms did not resolve and continued for couple of hours he went home and his wife noticed that his speech was quite garbled and he had trouble forming sentences which were fluent but made no sense.  He also complained of some headache on the left side of his headache.  He was taken to emergency room at [facility] where his noncontrast CAT scan of the head was obtained which showed an area of 6 cm low-density in the left temporoparietal junction consistent with an acute infarct.  Other possibilities including tumor were considered and CT angiogram of the head was done which showed no significant narrowing of large vessels in the brain or in the neck.  He was transferred to [facility] where an MRI scan was obtained which showed a moderate size left MCA branch infarct involving mostly temporal lobe.  MRA of the brain was unremarkable and MR venogram showed no evidence of venous sinus thrombosis.  CT angiogram of the neck was negative.  2D echo showed normal ejection fraction without cardiac source of embolism.  TEE was highly recommended but patient  refused.  Transcranial Doppler study was suboptimal due to significant artifacts but did suggest possible moderate-sized PFO.  LDL cholesterol was elevated at 214 mg percent and triglycerides at 537.  Hemoglobin A1c was elevated at 9.2.  Hypercoagulable panel labs were all negative.  Vitamin B12, TSH, RPR, HIV antibodies and ANA were all negative also.  Patient was started on aspirin and Plavix for 3 weeks followed by aspirin alone.  He is currently doing outpatient speech therapy.  He says his speech is significantly improved though he still has some difficulty with reading and it takes him a lot of time to send a text to read it.  He is currently doing outpatient speech therapy and feels its helping.  He denies any history of drug abuse, marijuana, cigarette smoking or excessive alcohol use.  There is no history of DVT, pulmonary embolism.  There is no family tree of strokes or heart attacks at a young age.  Patient states his been compliant with using his CPAP for sleep apnea and taking his medications for blood pressure, cholesterol and diabetes.  Is also started eating healthy and knows he needs to exercise and lose weight.  He appears not to be keen to get a TEE done but after discussion with me appears more agreeable to his end of this visit.  ROS:   14 system review of systems is positive for speech and word finding difficulties only and all other systems negative PMH:  Past Medical History:  Diagnosis Date  . GERD (gastroesophageal reflux disease)   .  Hypertension   . OCD (obsessive compulsive disorder)     Social History:  Social History   Socioeconomic History  . Marital status: Married    Spouse name: Not on file  . Number of children: Not on file  . Years of education: Not on file  . Highest education level: Not on file  Occupational History  . Not on file  Tobacco Use  . Smoking status: Never Smoker  . Smokeless tobacco: Never Used  Substance and Sexual Activity  . Alcohol  use: Not Currently  . Drug use: Never  . Sexual activity: Not on file  Other Topics Concern  . Not on file  Social History Narrative  . Not on file   Social Determinants of Health   Financial Resource Strain:   . Difficulty of Paying Living Expenses: Not on file  Food Insecurity:   . Worried About Programme researcher, broadcasting/film/video in the Last Year: Not on file  . Ran Out of Food in the Last Year: Not on file  Transportation Needs:   . Lack of Transportation (Medical): Not on file  . Lack of Transportation (Non-Medical): Not on file  Physical Activity:   . Days of Exercise per Week: Not on file  . Minutes of Exercise per Session: Not on file  Stress:   . Feeling of Stress : Not on file  Social Connections:   . Frequency of Communication with Friends and Family: Not on file  . Frequency of Social Gatherings with Friends and Family: Not on file  . Attends Religious Services: Not on file  . Active Member of Clubs or Organizations: Not on file  . Attends Banker Meetings: Not on file  . Marital Status: Not on file  Intimate Partner Violence:   . Fear of Current or Ex-Partner: Not on file  . Emotionally Abused: Not on file  . Physically Abused: Not on file  . Sexually Abused: Not on file    Medications:   Current Outpatient Medications on File Prior to Visit  Medication Sig Dispense Refill  . ARIPiprazole (ABILIFY) 15 MG tablet Take 15 mg by mouth daily.     Marland Kitchen aspirin EC 81 MG EC tablet Take 1 tablet (81 mg total) by mouth daily. Swallow whole. 30 tablet 11  . atorvastatin (LIPITOR) 80 MG tablet Take 1 tablet (80 mg total) by mouth daily. 30 tablet 3  . CONCERTA 54 MG CR tablet Take 54 mg by mouth daily.    Marland Kitchen DEXILANT 60 MG capsule Take 60 mg by mouth daily.    Marland Kitchen FLOVENT HFA 220 MCG/ACT inhaler Inhale 2 puffs into the lungs 2 (two) times daily.    . fluvoxaMINE (LUVOX) 100 MG tablet Take 150 mg by mouth daily.     . hydrOXYzine (VISTARIL) 25 MG capsule Take by mouth.    Marland Kitchen  lisinopril (ZESTRIL) 20 MG tablet Take 20 mg by mouth 2 (two) times daily.    . metFORMIN (GLUCOPHAGE) 500 MG tablet Take 1 tablet (500 mg total) by mouth 2 (two) times daily with a meal. 60 tablet 11   No current facility-administered medications on file prior to visit.    Allergies:   Allergies  Allergen Reactions  . Amoxicillin Diarrhea, Nausea Only and Anaphylaxis    Total body discomfort Total body discomfort   . Cat Hair Extract Shortness Of Breath  . Doxycycline Anaphylaxis and Anxiety    Total body discomfort, Stomach inflammation   . Pecan Nut (Diagnostic) Anaphylaxis  .  Telmisartan Other (See Comments)    headache  . Montelukast     Other reaction(s): Confusion (intolerance)    Physical Exam General: Moderately obese young Caucasian male seated, in no evident distress Head: head normocephalic and atraumatic.   Neck: supple with no carotid or supraclavicular bruits Cardiovascular: regular rate and rhythm, no murmurs Musculoskeletal: no deformity Skin:  no rash/petichiae Vascular:  Normal pulses all extremities  Neurologic Exam Mental Status: Awake and fully alert. Oriented to place and time. Recent and remote memory intact. Attention span, concentration and fund of knowledge appropriate. Mood and affect appropriate.  Speech is fluent without aphasia with only occasional word finding and naming difficulties.  Good comprehension and repetition. Cranial Nerves: Fundoscopic exam reveals sharp disc margins. Pupils equal, briskly reactive to light. Extraocular movements full without nystagmus. Visual fields full to confrontation. Hearing intact. Facial sensation intact. Face, tongue, palate moves normally and symmetrically.  Motor: Normal bulk and tone. Normal strength in all tested extremity muscles. Sensory.: intact to touch , pinprick , position and vibratory sensation.  Coordination: Rapid alternating movements normal in all extremities. Finger-to-nose and heel-to-shin  performed accurately bilaterally. Gait and Station: Arises from chair without difficulty. Stance is normal. Gait demonstrates normal stride length and balance . Able to heel, toe and tandem walk without difficulty.  Reflexes: 1+ and symmetric. Toes downgoing.   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  Extensive work-up for vasculitis, hypercoagulable states, inflammatory conditions and has been negative.     PLAN: I had a long discussion with the patient and his wife regarding his recent cryptogenic stroke and aphasia from which he seems to be improving quite well.  I recommend he continue ongoing speech therapy for language and stay on aspirin for stroke prevention and maintain aggressive risk factor modification with strict control of hypertension with blood pressure goal below 130/90, lipids with LDL cholesterol goal below 70 mg percent and diabetes with hemoglobin A1c goal below 6.5%.  Also encouraged him to be compliant with using his CPAP every night for sleep apnea as well as eat a healthy diet with lots of fruits, vegetables, cereals, whole grains and to be active and exercise regularly and lose weight.  I also recommend he do a TEE to evaluate for patent foramen ovale as well as repeat MRI scan of the brain with and without contrast to look for expected evolution of his stroke.  Greater than 50% time during this 45-minute visit was spent on counseling and coordination of care about his cryptogenic stroke and discussion about evaluation and treatment and answering questions.  He will return for follow-up with me in 3 months or call earlier if necessary. Delia Heady, MD  [facility] [address] [place], [place] [address]  Phone [phone] Fax [phone] Note: This  document was prepared with digital dictation and possible smart phrase technology. Any transcriptional errors that result from this process are unintentional.

## 2019-10-15 NOTE — Patient Instructions (Signed)
I had a long discussion with the patient and his wife regarding his recent cryptogenic stroke and aphasia from which he seems to be improving quite well.  I recommend he continue ongoing speech therapy for language and stay on aspirin for stroke prevention and maintain aggressive risk factor modification with strict control of hypertension with blood pressure goal below 130/90, lipids with LDL cholesterol goal below 70 mg percent and diabetes with hemoglobin A1c goal below 6.5%.  Also encouraged him to be compliant with using his CPAP every night for sleep apnea as well as eat a healthy diet with lots of fruits, vegetables, cereals, whole grains and to be active and exercise regularly and lose weight.  I also recommend he do a TEE to evaluate for patent foramen ovale as well as repeat MRI scan of the brain with and without contrast to look for expected evolution of his stroke.  He will return for follow-up with me in 3 months or call earlier if necessary.  Stroke Prevention Some medical conditions and behaviors are associated with a higher chance of having a stroke. You can help prevent a stroke by making nutrition, lifestyle, and other changes, including managing any medical conditions you may have. What nutrition changes can be made?   Eat healthy foods. You can do this by: ? Choosing foods high in fiber, such as fresh fruits and vegetables and whole grains. ? Eating at least 5 or more servings of fruits and vegetables a day. Try to fill half of your plate at each meal with fruits and vegetables. ? Choosing lean protein foods, such as lean cuts of meat, poultry without skin, fish, tofu, beans, and nuts. ? Eating low-fat dairy products. ? Avoiding foods that are high in salt (sodium). This can help lower blood pressure. ? Avoiding foods that have saturated fat, trans fat, and cholesterol. This can help prevent high cholesterol. ? Avoiding processed and premade foods.  Follow your health care  provider's specific guidelines for losing weight, controlling high blood pressure (hypertension), lowering high cholesterol, and managing diabetes. These may include: ? Reducing your daily calorie intake. ? Limiting your daily sodium intake to 1,500 milligrams (mg). ? Using only healthy fats for cooking, such as olive oil, canola oil, or sunflower oil. ? Counting your daily carbohydrate intake. What lifestyle changes can be made?  Maintain a healthy weight. Talk to your health care provider about your ideal weight.  Get at least 30 minutes of moderate physical activity at least 5 days a week. Moderate activity includes brisk walking, biking, and swimming.  Do not use any products that contain nicotine or tobacco, such as cigarettes and e-cigarettes. If you need help quitting, ask your health care provider. It may also be helpful to avoid exposure to secondhand smoke.  Limit alcohol intake to no more than 1 drink a day for nonpregnant women and 2 drinks a day for men. One drink equals 12 oz of beer, 5 oz of wine, or 1 oz of hard liquor.  Stop any illegal drug use.  Avoid taking birth control pills. Talk to your health care provider about the risks of taking birth control pills if: ? You are over 32 years old. ? You smoke. ? You get migraines. ? You have ever had a blood clot. What other changes can be made?  Manage your cholesterol levels. ? Eating a healthy diet is important for preventing high cholesterol. If cholesterol cannot be managed through diet alone, you may also need to take  medicines. ? Take any prescribed medicines to control your cholesterol as told by your health care provider.  Manage your diabetes. ? Eating a healthy diet and exercising regularly are important parts of managing your blood sugar. If your blood sugar cannot be managed through diet and exercise, you may need to take medicines. ? Take any prescribed medicines to control your diabetes as told by your health  care provider.  Control your hypertension. ? To reduce your risk of stroke, try to keep your blood pressure below 130/80. ? Eating a healthy diet and exercising regularly are an important part of controlling your blood pressure. If your blood pressure cannot be managed through diet and exercise, you may need to take medicines. ? Take any prescribed medicines to control hypertension as told by your health care provider. ? Ask your health care provider if you should monitor your blood pressure at home. ? Have your blood pressure checked every year, even if your blood pressure is normal. Blood pressure increases with age and some medical conditions.  Get evaluated for sleep disorders (sleep apnea). Talk to your health care provider about getting a sleep evaluation if you snore a lot or have excessive sleepiness.  Take over-the-counter and prescription medicines only as told by your health care provider. Aspirin or blood thinners (antiplatelets or anticoagulants) may be recommended to reduce your risk of forming blood clots that can lead to stroke.  Make sure that any other medical conditions you have, such as atrial fibrillation or atherosclerosis, are managed. What are the warning signs of a stroke? The warning signs of a stroke can be easily remembered as BEFAST.  B is for balance. Signs include: ? Dizziness. ? Loss of balance or coordination. ? Sudden trouble walking.  E is for eyes. Signs include: ? A sudden change in vision. ? Trouble seeing.  F is for face. Signs include: ? Sudden weakness or numbness of the face. ? The face or eyelid drooping to one side.  A is for arms. Signs include: ? Sudden weakness or numbness of the arm, usually on one side of the body.  S is for speech. Signs include: ? Trouble speaking (aphasia). ? Trouble understanding.  T is for time. ? These symptoms may represent a serious problem that is an emergency. Do not wait to see if the symptoms will go  away. Get medical help right away. Call your local emergency services (911 in the [place] Do not drive yourself to the hospital.  Other signs of stroke may include: ? A sudden, severe headache with no known cause. ? Nausea or vomiting. ? Seizure. Where to find more information For more information, visit:  American Stroke Association: www.strokeassociation.org  National Stroke Association: www.stroke.org Summary  You can prevent a stroke by eating healthy, exercising, not smoking, limiting alcohol intake, and managing any medical conditions you may have.  Do not use any products that contain nicotine or tobacco, such as cigarettes and e-cigarettes. If you need help quitting, ask your health care provider. It may also be helpful to avoid exposure to secondhand smoke.  Remember BEFAST for warning signs of stroke. Get help right away if you or a loved one has any of these signs. This information is not intended to replace advice given to you by your health care provider. Make sure you discuss any questions you have with your health care provider. Document Revised: 12/23/2016 Document Reviewed: 02/16/2016 Elsevier Patient Education  2020 [employer].

## 2019-10-16 ENCOUNTER — Telehealth: Payer: Self-pay | Admitting: Neurology

## 2019-10-16 NOTE — Telephone Encounter (Signed)
no to the covid questions MR Brain w/wo contrast Dr. Pearlean Brownie [facility] Auth: [id number] (exp. 10/15/19 to 11/29/19). Patient is scheduled at [facility] for 10/23/19.

## 2019-10-22 ENCOUNTER — Other Ambulatory Visit: Payer: Self-pay | Admitting: Neurology

## 2019-10-22 MED ORDER — ALPRAZOLAM 0.25 MG PO TABS: ORAL_TABLET | ORAL | 0 refills | Status: DC

## 2019-10-22 NOTE — Telephone Encounter (Signed)
Patient r/s for 10/29/19. He states he does want some Xanax to help and he is going to have a driver. But he is on other types of medication and he wants to make sure it is okay for him to take the Xanax with the other medicine he is on.

## 2019-10-22 NOTE — Telephone Encounter (Signed)
I don't see any interactions. But this medication can make him sedated so have a driver and be careful. There can always be side effects to any medication and he can discuss with the pharmacist when he picks it up thanks.

## 2019-10-22 NOTE — Telephone Encounter (Signed)
I had spoken with the pt's wife [name] (on [place]) and confirmed the medication list we have is accurate except he is not currently taking Luvox and she added Flonase prn. She is aware patient should not drive while taking this medication and is aware of the instructions.

## 2019-10-22 NOTE — Telephone Encounter (Signed)
That's fine. thanks

## 2019-10-22 NOTE — Telephone Encounter (Signed)
Noted, I left a voicemail informing this to the patient.

## 2019-10-22 NOTE — Progress Notes (Signed)
xanx

## 2019-10-23 ENCOUNTER — Other Ambulatory Visit: Payer: 59

## 2019-10-29 ENCOUNTER — Ambulatory Visit: Payer: 59

## 2019-10-29 DIAGNOSIS — I639 Cerebral infarction, unspecified: Secondary | ICD-10-CM | POA: Diagnosis not present

## 2019-10-29 MED ORDER — GADOBENATE DIMEGLUMINE 529 MG/ML IV SOLN
20.0000 mL | Freq: Once | INTRAVENOUS | Status: AC | PRN
  Administered 2019-10-29: 20 mL via INTRAVENOUS

## 2019-11-03 NOTE — Progress Notes (Signed)
Kindly inform the patient her MRI scan of the brain shows expected evolutionary changes in his recent stroke from August 2021 without any new or worrisome findings

## 2019-11-04 ENCOUNTER — Telehealth: Payer: Self-pay | Admitting: Emergency Medicine

## 2019-11-04 NOTE — Telephone Encounter (Signed)
Called and spoke to [name], patient's wife/on DPR, regarding Dr. Marlis Edelson findings of MRI brain.  [name] had a few questions regarding medical terminology, ie, cytotoxic edema and petechial hemorrhage.  Explained the cytotoxic edema resolving is resulting from increased fluid in the brain and resolving meant it was decreasing, petechial hemorrhage is small capillary that burst/bled and left small 'bruising' under the skin.  It was also resolving on medical report patient read.  Patient's wife had no further questions and expressed appreciation.

## 2020-01-27 ENCOUNTER — Ambulatory Visit: Payer: Self-pay | Admitting: Psychiatry

## 2020-02-03 ENCOUNTER — Ambulatory Visit: Payer: 59 | Admitting: Neurology

## 2020-03-31 ENCOUNTER — Ambulatory Visit: Payer: 59 | Admitting: Neurology

## 2020-10-16 DIAGNOSIS — F332 Major depressive disorder, recurrent severe without psychotic features: Secondary | ICD-10-CM | POA: Diagnosis present

## 2020-10-16 DIAGNOSIS — F909 Attention-deficit hyperactivity disorder, unspecified type: Secondary | ICD-10-CM | POA: Diagnosis present

## 2020-10-16 DIAGNOSIS — G4733 Obstructive sleep apnea (adult) (pediatric): Secondary | ICD-10-CM | POA: Diagnosis present

## 2020-10-16 DIAGNOSIS — E119 Type 2 diabetes mellitus without complications: Secondary | ICD-10-CM

## 2020-10-16 DIAGNOSIS — E1165 Type 2 diabetes mellitus with hyperglycemia: Secondary | ICD-10-CM | POA: Diagnosis present

## 2020-10-16 DIAGNOSIS — E782 Mixed hyperlipidemia: Secondary | ICD-10-CM | POA: Diagnosis present

## 2021-10-16 ENCOUNTER — Emergency Department (HOSPITAL_BASED_OUTPATIENT_CLINIC_OR_DEPARTMENT_OTHER): Payer: 59

## 2021-10-16 ENCOUNTER — Observation Stay (HOSPITAL_BASED_OUTPATIENT_CLINIC_OR_DEPARTMENT_OTHER)
Admission: EM | Admit: 2021-10-16 | Discharge: 2021-10-17 | Disposition: A | Discharge status: Home / Self Care | Payer: 59 | Attending: Family Medicine | Admitting: Family Medicine

## 2021-10-16 ENCOUNTER — Encounter (HOSPITAL_COMMUNITY): Payer: Self-pay

## 2021-10-16 ENCOUNTER — Other Ambulatory Visit: Payer: Self-pay

## 2021-10-16 ENCOUNTER — Encounter (HOSPITAL_BASED_OUTPATIENT_CLINIC_OR_DEPARTMENT_OTHER): Payer: Self-pay | Admitting: Emergency Medicine

## 2021-10-16 DIAGNOSIS — F429 Obsessive-compulsive disorder, unspecified: Secondary | ICD-10-CM | POA: Diagnosis present

## 2021-10-16 DIAGNOSIS — G4733 Obstructive sleep apnea (adult) (pediatric): Secondary | ICD-10-CM | POA: Diagnosis present

## 2021-10-16 DIAGNOSIS — Z79899 Other long term (current) drug therapy: Secondary | ICD-10-CM | POA: Insufficient documentation

## 2021-10-16 DIAGNOSIS — J4540 Moderate persistent asthma, uncomplicated: Secondary | ICD-10-CM | POA: Diagnosis present

## 2021-10-16 DIAGNOSIS — Z20822 Contact with and (suspected) exposure to covid-19: Secondary | ICD-10-CM | POA: Diagnosis not present

## 2021-10-16 DIAGNOSIS — F332 Major depressive disorder, recurrent severe without psychotic features: Secondary | ICD-10-CM | POA: Diagnosis present

## 2021-10-16 DIAGNOSIS — E871 Hypo-osmolality and hyponatremia: Secondary | ICD-10-CM

## 2021-10-16 DIAGNOSIS — Z8673 Personal history of transient ischemic attack (TIA), and cerebral infarction without residual deficits: Secondary | ICD-10-CM | POA: Diagnosis not present

## 2021-10-16 DIAGNOSIS — E1169 Type 2 diabetes mellitus with other specified complication: Secondary | ICD-10-CM | POA: Insufficient documentation

## 2021-10-16 DIAGNOSIS — A419 Sepsis, unspecified organism: Secondary | ICD-10-CM

## 2021-10-16 DIAGNOSIS — J219 Acute bronchiolitis, unspecified: Secondary | ICD-10-CM

## 2021-10-16 DIAGNOSIS — R059 Cough, unspecified: Secondary | ICD-10-CM | POA: Diagnosis present

## 2021-10-16 DIAGNOSIS — Z7984 Long term (current) use of oral hypoglycemic drugs: Secondary | ICD-10-CM | POA: Insufficient documentation

## 2021-10-16 DIAGNOSIS — R7989 Other specified abnormal findings of blood chemistry: Secondary | ICD-10-CM | POA: Diagnosis not present

## 2021-10-16 DIAGNOSIS — E1165 Type 2 diabetes mellitus with hyperglycemia: Secondary | ICD-10-CM | POA: Diagnosis not present

## 2021-10-16 DIAGNOSIS — Z7982 Long term (current) use of aspirin: Secondary | ICD-10-CM | POA: Diagnosis not present

## 2021-10-16 DIAGNOSIS — K219 Gastro-esophageal reflux disease without esophagitis: Secondary | ICD-10-CM | POA: Diagnosis present

## 2021-10-16 DIAGNOSIS — Z6841 Body Mass Index (BMI) 40.0 and over, adult: Secondary | ICD-10-CM | POA: Insufficient documentation

## 2021-10-16 DIAGNOSIS — E782 Mixed hyperlipidemia: Secondary | ICD-10-CM | POA: Diagnosis present

## 2021-10-16 DIAGNOSIS — I10 Essential (primary) hypertension: Secondary | ICD-10-CM | POA: Diagnosis present

## 2021-10-16 DIAGNOSIS — F909 Attention-deficit hyperactivity disorder, unspecified type: Secondary | ICD-10-CM | POA: Diagnosis present

## 2021-10-16 DIAGNOSIS — R7401 Elevation of levels of liver transaminase levels: Secondary | ICD-10-CM | POA: Diagnosis not present

## 2021-10-16 DIAGNOSIS — N179 Acute kidney failure, unspecified: Secondary | ICD-10-CM | POA: Diagnosis not present

## 2021-10-16 DIAGNOSIS — E119 Type 2 diabetes mellitus without complications: Secondary | ICD-10-CM

## 2021-10-16 DIAGNOSIS — J189 Pneumonia, unspecified organism: Secondary | ICD-10-CM | POA: Diagnosis not present

## 2021-10-16 DIAGNOSIS — K760 Fatty (change of) liver, not elsewhere classified: Secondary | ICD-10-CM | POA: Diagnosis not present

## 2021-10-16 DIAGNOSIS — R4182 Altered mental status, unspecified: Secondary | ICD-10-CM | POA: Insufficient documentation

## 2021-10-16 LAB — BASIC METABOLIC PANEL
Anion gap: 12 (ref 5–15)
BUN: 46 mg/dL — ABNORMAL HIGH (ref 6–20)
CO2: 22 mmol/L (ref 22–32)
Calcium: 9.2 mg/dL (ref 8.9–10.3)
Chloride: 94 mmol/L — ABNORMAL LOW (ref 98–111)
Creatinine, Ser: 2.03 mg/dL — ABNORMAL HIGH (ref 0.61–1.24)
GFR, Estimated: 44 mL/min — ABNORMAL LOW (ref >60)
Glucose, Bld: 97 mg/dL (ref 70–99)
Potassium: 3.6 mmol/L (ref 3.5–5.1)
Sodium: 128 mmol/L — ABNORMAL LOW (ref 135–145)

## 2021-10-16 LAB — CBC WITH DIFFERENTIAL/PLATELET
Abs Immature Granulocytes: 0.08 10*3/uL — ABNORMAL HIGH (ref 0.00–0.07)
Basophils Absolute: 0.1 10*3/uL (ref 0.0–0.1)
Basophils Relative: 1 %
Eosinophils Absolute: 0.2 10*3/uL (ref 0.0–0.5)
Eosinophils Relative: 2 %
HCT: 39.3 % (ref 39.0–52.0)
Hemoglobin: 13.6 g/dL (ref 13.0–17.0)
Immature Granulocytes: 1 %
Lymphocytes Relative: 23 %
Lymphs Abs: 2.9 10*3/uL (ref 0.7–4.0)
MCH: 27.5 pg (ref 26.0–34.0)
MCHC: 34.6 g/dL (ref 30.0–36.0)
MCV: 79.4 fL — ABNORMAL LOW (ref 80.0–100.0)
Monocytes Absolute: 1.2 10*3/uL — ABNORMAL HIGH (ref 0.1–1.0)
Monocytes Relative: 10 %
Neutro Abs: 8.3 10*3/uL — ABNORMAL HIGH (ref 1.7–7.7)
Neutrophils Relative %: 63 %
Platelets: 383 10*3/uL (ref 150–400)
RBC: 4.95 MIL/uL (ref 4.22–5.81)
RDW: 13.8 % (ref 11.5–15.5)
WBC: 12.8 10*3/uL — ABNORMAL HIGH (ref 4.0–10.5)
nRBC: 0 % (ref 0.0–0.2)

## 2021-10-16 LAB — BRAIN NATRIURETIC PEPTIDE: B Natriuretic Peptide: 7.5 pg/mL (ref 0.0–100.0)

## 2021-10-16 LAB — I-STAT VENOUS BLOOD GAS, ED
Acid-base deficit: 4 mmol/L — ABNORMAL HIGH (ref 0.0–2.0)
Bicarbonate: 20.3 mmol/L (ref 20.0–28.0)
Calcium, Ion: 1.14 mmol/L — ABNORMAL LOW (ref 1.15–1.40)
HCT: 42 % (ref 39.0–52.0)
Hemoglobin: 14.3 g/dL (ref 13.0–17.0)
O2 Saturation: 77 %
Patient temperature: 98.6
Potassium: 3.8 mmol/L (ref 3.5–5.1)
Sodium: 128 mmol/L — ABNORMAL LOW (ref 135–145)
TCO2: 21 mmol/L — ABNORMAL LOW (ref 22–32)
pCO2, Ven: 36 mmHg — ABNORMAL LOW (ref 44–60)
pH, Ven: 7.361 (ref 7.25–7.43)
pO2, Ven: 42 mmHg (ref 32–45)

## 2021-10-16 LAB — COMPREHENSIVE METABOLIC PANEL
ALT: 87 U/L — ABNORMAL HIGH (ref 0–44)
AST: 129 U/L — ABNORMAL HIGH (ref 15–41)
Albumin: 4.2 g/dL (ref 3.5–5.0)
Alkaline Phosphatase: 71 U/L (ref 38–126)
Anion gap: 15 (ref 5–15)
BUN: 45 mg/dL — ABNORMAL HIGH (ref 6–20)
CO2: 19 mmol/L — ABNORMAL LOW (ref 22–32)
Calcium: 9.2 mg/dL (ref 8.9–10.3)
Chloride: 92 mmol/L — ABNORMAL LOW (ref 98–111)
Creatinine, Ser: 2.67 mg/dL — ABNORMAL HIGH (ref 0.61–1.24)
GFR, Estimated: 31 mL/min — ABNORMAL LOW (ref >60)
Glucose, Bld: 111 mg/dL — ABNORMAL HIGH (ref 70–99)
Potassium: 3.7 mmol/L (ref 3.5–5.1)
Sodium: 126 mmol/L — ABNORMAL LOW (ref 135–145)
Total Bilirubin: 1.3 mg/dL — ABNORMAL HIGH (ref 0.3–1.2)
Total Protein: 8.4 g/dL — ABNORMAL HIGH (ref 6.5–8.1)

## 2021-10-16 LAB — GLUCOSE, CAPILLARY: Glucose-Capillary: 114 mg/dL — ABNORMAL HIGH (ref 70–99)

## 2021-10-16 LAB — TROPONIN I (HIGH SENSITIVITY)
Troponin I (High Sensitivity): 11 ng/L (ref <18)
Troponin I (High Sensitivity): 9 ng/L (ref <18)

## 2021-10-16 LAB — SARS CORONAVIRUS 2 BY RT PCR: SARS Coronavirus 2 by RT PCR: NEGATIVE

## 2021-10-16 LAB — D-DIMER, QUANTITATIVE: D-Dimer, Quant: 1.39 ug/mL-FEU — ABNORMAL HIGH (ref 0.00–0.50)

## 2021-10-16 LAB — LACTIC ACID, PLASMA: Lactic Acid, Venous: 1.5 mmol/L (ref 0.5–1.9)

## 2021-10-16 MED ORDER — ALBUTEROL SULFATE (2.5 MG/3ML) 0.083% IN NEBU: 2.5000 mg | INHALATION_SOLUTION | Freq: Four times a day (QID) | RESPIRATORY_TRACT | Status: DC | PRN

## 2021-10-16 MED ORDER — ENOXAPARIN SODIUM 40 MG/0.4ML IJ SOSY: 40.0000 mg | PREFILLED_SYRINGE | INTRAMUSCULAR | Status: DC

## 2021-10-16 MED ORDER — HYDROXYZINE PAMOATE 25 MG PO CAPS: 25.0000 mg | ORAL_CAPSULE | Freq: Two times a day (BID) | ORAL | Status: DC | PRN

## 2021-10-16 MED ORDER — ARIPIPRAZOLE 10 MG PO TABS
30.0000 mg | ORAL_TABLET | Freq: Every day | ORAL | Status: DC
  Administered 2021-10-17: 30 mg via ORAL
  Filled 2021-10-16: qty 3

## 2021-10-16 MED ORDER — LEVOFLOXACIN IN D5W 750 MG/150ML IV SOLN: 750.0000 mg | INTRAVENOUS | Status: DC

## 2021-10-16 MED ORDER — TRAZODONE HCL 50 MG PO TABS
50.0000 mg | ORAL_TABLET | Freq: Every day | ORAL | Status: DC
  Administered 2021-10-16: 50 mg via ORAL
  Filled 2021-10-16: qty 1

## 2021-10-16 MED ORDER — BUDESONIDE 0.5 MG/2ML IN SUSP
RESPIRATORY_TRACT | Status: AC
  Administered 2021-10-16: 1 mg via RESPIRATORY_TRACT
  Filled 2021-10-16: qty 4

## 2021-10-16 MED ORDER — INSULIN ASPART 100 UNIT/ML IJ SOLN: 0.0000 [IU] | Freq: Three times a day (TID) | INTRAMUSCULAR | Status: DC

## 2021-10-16 MED ORDER — POLYETHYLENE GLYCOL 3350 17 G PO PACK: 17.0000 g | PACK | Freq: Every day | ORAL | Status: DC | PRN

## 2021-10-16 MED ORDER — LEVOFLOXACIN IN D5W 750 MG/150ML IV SOLN
750.0000 mg | Freq: Once | INTRAVENOUS | Status: AC
  Administered 2021-10-16: 750 mg via INTRAVENOUS
  Filled 2021-10-16: qty 150

## 2021-10-16 MED ORDER — SODIUM CHLORIDE 0.9% FLUSH
3.0000 mL | Freq: Two times a day (BID) | INTRAVENOUS | Status: DC
  Administered 2021-10-16 – 2021-10-17 (×2): 3 mL via INTRAVENOUS

## 2021-10-16 MED ORDER — BUPROPION HCL ER (XL) 300 MG PO TB24
300.0000 mg | ORAL_TABLET | Freq: Every day | ORAL | Status: DC
  Administered 2021-10-17: 300 mg via ORAL
  Filled 2021-10-16: qty 1

## 2021-10-16 MED ORDER — ATORVASTATIN CALCIUM 40 MG PO TABS
80.0000 mg | ORAL_TABLET | Freq: Every day | ORAL | Status: DC
  Administered 2021-10-17: 80 mg via ORAL
  Filled 2021-10-16: qty 2

## 2021-10-16 MED ORDER — ALBUTEROL SULFATE HFA 108 (90 BASE) MCG/ACT IN AERS: 2.0000 | INHALATION_SPRAY | Freq: Four times a day (QID) | RESPIRATORY_TRACT | Status: DC | PRN

## 2021-10-16 MED ORDER — ACETAMINOPHEN 650 MG RE SUPP: 650.0000 mg | Freq: Four times a day (QID) | RECTAL | Status: DC | PRN

## 2021-10-16 MED ORDER — LACTATED RINGERS IV BOLUS
1000.0000 mL | Freq: Once | INTRAVENOUS | Status: AC
  Administered 2021-10-16: 1000 mL via INTRAVENOUS

## 2021-10-16 MED ORDER — ARIPIPRAZOLE 10 MG PO TABS: 15.0000 mg | ORAL_TABLET | Freq: Every day | ORAL | Status: DC

## 2021-10-16 MED ORDER — PANTOPRAZOLE SODIUM 40 MG PO TBEC
40.0000 mg | DELAYED_RELEASE_TABLET | Freq: Two times a day (BID) | ORAL | Status: DC
  Administered 2021-10-17: 40 mg via ORAL
  Filled 2021-10-16: qty 1

## 2021-10-16 MED ORDER — PANTOPRAZOLE SODIUM 40 MG PO TBEC: 40.0000 mg | DELAYED_RELEASE_TABLET | Freq: Every day | ORAL | Status: DC

## 2021-10-16 MED ORDER — METHYLPHENIDATE HCL ER (OSM) 18 MG PO TBCR: 54.0000 mg | EXTENDED_RELEASE_TABLET | Freq: Every day | ORAL | Status: DC

## 2021-10-16 MED ORDER — LACTATED RINGERS IV BOLUS: 1000.0000 mL | Freq: Once | INTRAVENOUS | Status: DC

## 2021-10-16 MED ORDER — ACETAMINOPHEN 325 MG PO TABS: 650.0000 mg | ORAL_TABLET | Freq: Four times a day (QID) | ORAL | Status: DC | PRN

## 2021-10-16 MED ORDER — ENOXAPARIN SODIUM 80 MG/0.8ML IJ SOSY
75.0000 mg | PREFILLED_SYRINGE | INTRAMUSCULAR | Status: DC
  Administered 2021-10-16: 75 mg via SUBCUTANEOUS
  Filled 2021-10-16: qty 0.8

## 2021-10-16 MED ORDER — CITALOPRAM HYDROBROMIDE 20 MG PO TABS
20.0000 mg | ORAL_TABLET | Freq: Every day | ORAL | Status: DC
  Administered 2021-10-17: 20 mg via ORAL
  Filled 2021-10-16: qty 1

## 2021-10-16 MED ORDER — BUDESONIDE 0.5 MG/2ML IN SUSP: 1.0000 mg | Freq: Two times a day (BID) | RESPIRATORY_TRACT | Status: DC

## 2021-10-16 MED ORDER — AMPHETAMINE-DEXTROAMPHETAMINE 30 MG PO TABS: 60.0000 mg | ORAL_TABLET | Freq: Two times a day (BID) | ORAL | Status: DC

## 2021-10-16 MED ORDER — ASPIRIN 81 MG PO TBEC
81.0000 mg | DELAYED_RELEASE_TABLET | Freq: Every day | ORAL | Status: DC
  Administered 2021-10-17: 81 mg via ORAL
  Filled 2021-10-16: qty 1

## 2021-10-16 NOTE — ED Notes (Signed)
ED TO INPATIENT HANDOFF REPORT  ED Nurse Name and Phone #: Baxter Flattery, RN  S Name/Age/Gender Bruce Cross 31 y.o. male Room/Bed: MH11/MH11  Code Status   Code Status: Prior  Home/SNF/Other Home Patient oriented to: self, place, time, and situation Is this baseline? Yes   Triage Complete: Triage complete  Chief Complaint AKI (acute kidney injury) ([place]) [N17.9]  Triage Note Patient in with c/o cough and congestion x 2 weeks. Visitor reports patient with history of OCD and PTSD, patient does not sleep.   Allergies Allergies  Allergen Reactions   Amoxicillin Diarrhea, Nausea Only and Anaphylaxis    Total body discomfort Total body discomfort    Cat Hair Extract Shortness Of Breath   Doxycycline Anaphylaxis and Anxiety    Total body discomfort, Stomach inflammation    Pecan Nut (Diagnostic) Anaphylaxis   Telmisartan Other (See Comments)    headache   Montelukast     Other reaction(s): Confusion (intolerance)    Level of Care/Admitting Diagnosis ED Disposition     ED Disposition  Admit   Condition  --   Comment  Hospital Area: [place] [[id number]]  Level of Care: Telemetry [5]  Admit to tele based on following criteria: Complex arrhythmia (Bradycardia/Tachycardia)  Interfacility transfer: Yes  May place patient in observation at [facility] or [place] if equivalent level of care is available:: Yes  Covid Evaluation: Asymptomatic - no recent exposure (last 10 days) testing not required  Diagnosis: AKI (acute kidney injury) [facility]) [[id number]]  Admitting Physician: Antonieta Pert [[id number]]  Attending Physician: Deno Etienne [[id number]]          B Medical/Surgery History Past Medical History:  Diagnosis Date   GERD (gastroesophageal reflux disease)    Hypertension    OCD (obsessive compulsive disorder)    History reviewed. No pertinent surgical history.   A IV Location/Drains/Wounds Patient Lines/Drains/Airways Status     Active  Line/Drains/Airways     Name Placement date Placement time Site Days   Peripheral IV 10/16/21 20 G 1" Anterior;Distal;Right;Upper Arm 10/16/21  1423  Arm  less than 1            Intake/Output Last 24 hours No intake or output data in the 24 hours ending 10/16/21 1712  Labs/Imaging Results for orders placed or performed during the hospital encounter of 10/16/21 (from the past 48 hour(s))  SARS Coronavirus 2 by RT PCR (hospital order, performed in [facility] hospital lab) *cepheid single result test* Anterior Nasal Swab     Status: None   Collection Time: 10/16/21  1:21 PM   Specimen: Anterior Nasal Swab  Result Value Ref Range   SARS Coronavirus 2 by RT PCR NEGATIVE NEGATIVE    Comment: (NOTE) SARS-CoV-2 target nucleic acids are NOT DETECTED.  The SARS-CoV-2 RNA is generally detectable in upper and lower respiratory specimens during the acute phase of infection. The lowest concentration of SARS-CoV-2 viral copies this assay can detect is 250 copies / mL. A negative result does not preclude SARS-CoV-2 infection and should not be used as the sole basis for treatment or other patient management decisions.  A negative result may occur with improper specimen collection / handling, submission of specimen other than nasopharyngeal swab, presence of viral mutation(s) within the areas targeted by this assay, and inadequate number of viral copies (<250 copies / mL). A negative result must be combined with clinical observations, patient history, and epidemiological information.  Fact Sheet for Patients:   https://www.patel.info/  Fact Sheet for  Healthcare Providers: http://kim-miller.com/  This test is not yet approved or  cleared by the [place] and has been authorized for detection and/or diagnosis of SARS-CoV-2 by FDA under an Emergency Use Authorization (EUA).  This EUA will remain in effect (meaning this test can be used) for the  duration of the COVID-19 declaration under Section 564(b)(1) of the Act, 21 U.S.C. section 360bbb-3(b)(1), unless the authorization is terminated or revoked sooner.  Performed at [facility], [address] [place], [place] [address]   Comprehensive metabolic panel     Status: Abnormal   Collection Time: 10/16/21  2:22 PM  Result Value Ref Range   Sodium 126 (L) 135 - 145 mmol/L   Potassium 3.7 3.5 - 5.1 mmol/L   Chloride 92 (L) 98 - 111 mmol/L   CO2 19 (L) 22 - 32 mmol/L   Glucose, Bld 111 (H) 70 - 99 mg/dL    Comment: Glucose reference range applies only to samples taken after fasting for at least 8 hours.   BUN 45 (H) 6 - 20 mg/dL   Creatinine, Ser 9.75 (H) 0.61 - 1.24 mg/dL   Calcium 9.2 8.9 - 88.3 mg/dL   Total Protein 8.4 (H) 6.5 - 8.1 g/dL   Albumin 4.2 3.5 - 5.0 g/dL   AST 254 (H) 15 - 41 U/L   ALT 87 (H) 0 - 44 U/L   Alkaline Phosphatase 71 38 - 126 U/L   Total Bilirubin 1.3 (H) 0.3 - 1.2 mg/dL   GFR, Estimated 31 (L) >60 mL/min    Comment: (NOTE) Calculated using the CKD-EPI Creatinine Equation (2021)    Anion gap 15 5 - 15    Comment: Performed at [facility], [address] [place], [place] [address]  CBC with Differential     Status: Abnormal   Collection Time: 10/16/21  2:22 PM  Result Value Ref Range   WBC 12.8 (H) 4.0 - 10.5 K/uL   RBC 4.95 4.22 - 5.81 MIL/uL   Hemoglobin 13.6 13.0 - 17.0 g/dL   HCT 15.8 30.9 - 40.7 %   MCV 79.4 (L) 80.0 - 100.0 fL   MCH 27.5 26.0 - 34.0 pg   MCHC 34.6 30.0 - 36.0 g/dL   RDW 68.0 88.1 - 10.3 %   Platelets 383 150 - 400 K/uL   nRBC 0.0 0.0 - 0.2 %   Neutrophils Relative % 63 %   Neutro Abs 8.3 (H) 1.7 - 7.7 K/uL   Lymphocytes Relative 23 %   Lymphs Abs 2.9 0.7 - 4.0 K/uL   Monocytes Relative 10 %   Monocytes Absolute 1.2 (H) 0.1 - 1.0 K/uL   Eosinophils Relative 2 %   Eosinophils Absolute 0.2 0.0 - 0.5 K/uL   Basophils Relative 1 %   Basophils Absolute 0.1 0.0 - 0.1 K/uL   Immature  Granulocytes 1 %   Abs Immature Granulocytes 0.08 (H) 0.00 - 0.07 K/uL    Comment: Performed at [facility], [address] [place], [place] [address]  Troponin I (High Sensitivity)     Status: None   Collection Time: 10/16/21  2:22 PM  Result Value Ref Range   Troponin I (High Sensitivity) 11 <18 ng/L    Comment: (NOTE) Elevated high sensitivity troponin I (hsTnI) values and significant  changes across serial measurements may suggest ACS but many other  chronic and acute conditions are known to elevate hsTnI results.  Refer to the "Links"  section for chest pain algorithms and additional  guidance. Performed at [facility], [address] [place], [place] [address]   Lactic acid, plasma     Status: None   Collection Time: 10/16/21  2:22 PM  Result Value Ref Range   Lactic Acid, Venous 1.5 0.5 - 1.9 mmol/L    Comment: Performed at [facility], [address] [place], [place] [address]  D-dimer, quantitative     Status: Abnormal   Collection Time: 10/16/21  2:27 PM  Result Value Ref Range   D-Dimer, Quant 1.39 (H) 0.00 - 0.50 ug/mL-FEU    Comment: (NOTE) At the manufacturer cut-off value of 0.5 g/mL FEU, this assay has a negative predictive value of 95-100%.This assay is intended for use in conjunction with a clinical pretest probability (PTP) assessment model to exclude pulmonary embolism (PE) and deep venous thrombosis (DVT) in outpatients suspected of PE or DVT. Results should be correlated with clinical presentation. Performed at [facility], [address] [place], [place] [address]   I-Stat venous blood gas, ED     Status: Abnormal   Collection Time: 10/16/21  2:30 PM  Result Value Ref Range   pH, Ven 7.361 7.25 - 7.43   pCO2, Ven 36.0 (L) 44 - 60 mmHg   pO2, Ven 42 32 - 45 mmHg   Bicarbonate 20.3 20.0 - 28.0 mmol/L   TCO2 21 (L) 22 - 32 mmol/L   O2 Saturation 77 %   Acid-base deficit 4.0 (H) 0.0 - 2.0 mmol/L    Sodium 128 (L) 135 - 145 mmol/L   Potassium 3.8 3.5 - 5.1 mmol/L   Calcium, Ion 1.14 (L) 1.15 - 1.40 mmol/L   HCT 42.0 39.0 - 52.0 %   Hemoglobin 14.3 13.0 - 17.0 g/dL   Patient temperature 07.3 F    Collection site IV start    Drawn by Nurse    Sample type VENOUS   Troponin I (High Sensitivity)     Status: None   Collection Time: 10/16/21  4:15 PM  Result Value Ref Range   Troponin I (High Sensitivity) 9 <18 ng/L    Comment: (NOTE) Elevated high sensitivity troponin I (hsTnI) values and significant  changes across serial measurements may suggest ACS but many other  chronic and acute conditions are known to elevate hsTnI results.  Refer to the "Links" section for chest pain algorithms and additional  guidance. Performed at [facility], [address], [place], [place] [address]    CT Chest Wo Contrast  Result Date: 10/16/2021 CLINICAL DATA:  Chronic cough for 2 weeks, failed empiric treatment. EXAM: CT CHEST WITHOUT CONTRAST TECHNIQUE: Multidetector CT imaging of the chest was performed following the standard protocol without IV contrast. RADIATION DOSE REDUCTION: This exam was performed according to the departmental dose-optimization program which includes automated exposure control, adjustment of the mA and/or kV according to patient size and/or use of iterative reconstruction technique. COMPARISON:  Chest radiograph from earlier today. FINDINGS: Cardiovascular: Normal heart size. No significant pericardial effusion/thickening. Great vessels are normal in course and caliber. Mediastinum/Nodes: No significant thyroid nodules. Unremarkable esophagus. No pathologically enlarged axillary, mediastinal or hilar lymph nodes, noting limited sensitivity for the detection of hilar adenopathy on this noncontrast study. Lungs/Pleura: No pneumothorax. No pleural effusion. Patchy segmental centrilobular micronodularity in the right upper lobe (series 3/image 51). Otherwise clear lungs with  no lung masses or significant pulmonary nodules. No consolidative airspace disease.  Upper abdomen: Diffuse hepatic steatosis. Musculoskeletal: No aggressive appearing focal osseous lesions. Mild lower thoracic spondylosis. IMPRESSION: 1. Patchy segmental centrilobular micronodularity in the right upper lobe, compatible with a nonspecific mild infectious or inflammatory bronchiolitis. 2. Diffuse hepatic steatosis. Electronically Signed   By: Delbert Phenix M.D.   On: 10/16/2021 15:33   CT Head Wo Contrast  Result Date: 10/16/2021 CLINICAL DATA:  Mental status change, unknown cause EXAM: CT HEAD WITHOUT CONTRAST TECHNIQUE: Contiguous axial images were obtained from the base of the skull through the vertex without intravenous contrast. RADIATION DOSE REDUCTION: This exam was performed according to the departmental dose-optimization program which includes automated exposure control, adjustment of the mA and/or kV according to patient size and/or use of iterative reconstruction technique. COMPARISON:  09/18/2019, 09/19/2019 FINDINGS: Brain: No evidence of acute infarction, hemorrhage, hydrocephalus, extra-axial collection or mass lesion/mass effect. 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 Vascular: No hyperdense vessel or unexpected calcification. Skull: Normal. Negative for fracture or focal lesion. Sinuses/Orbits: No acute finding. Other: None. IMPRESSION: 1. No acute intracranial abnormality. 2. 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 Electronically Signed   By: Duanne Guess D.O.   On: 10/16/2021 15:32   DG Chest Portable 1 View  Result Date: 10/16/2021 CLINICAL DATA:  Cough and congestion for 2 weeks. EXAM: PORTABLE CHEST 1 VIEW COMPARISON:  None Available. FINDINGS: The heart size and mediastinal contours are within normal limits. Both lungs are clear. The visualized skeletal structures are unremarkable. IMPRESSION: No active disease. Electronically  Signed   By: Ted Mcalpine M.D.   On: 10/16/2021 13:54    Pending Labs Unresulted Labs (From admission, onward)     Start     Ordered   10/16/21 1653  Brain natriuretic peptide  Once,   URGENT        10/16/21 1655   10/16/21 1503  Urinalysis, Routine w reflex microscopic  Once,   URGENT        10/16/21 1502            Vitals/Pain Today's Vitals   10/16/21 1325 10/16/21 1500 10/16/21 1502 10/16/21 1608  BP: (!) 87/43 124/61 124/61 101/66  Pulse: (!) 123 (!) 113 (!) 116 (!) 114  Resp: 18 17 (!) 21 20  Temp: 98.1 F (36.7 C) 98.1 F (36.7 C) 98.1 F (36.7 C) 98.1 F (36.7 C)  TempSrc: Oral     SpO2: 100% 98% 100% 100%  Weight:      Height:      PainSc:        Isolation Precautions No active isolations  Medications Medications  levofloxacin (LEVAQUIN) IVPB 750 mg (750 mg Intravenous New Bag/Given 10/16/21 1613)  lactated ringers bolus 1,000 mL (0 mLs Intravenous Stopped 10/16/21 1606)    Mobility walks Low fall risk   Focused Assessments Pulmonary Assessment Handoff:  Lung sounds:   O2 Device: Room Air      R Recommendations: See Admitting Provider Note  Report given to:   Additional Notes:

## 2021-10-16 NOTE — ED Notes (Signed)
[facility] at bedside 

## 2021-10-16 NOTE — H&P (Addendum)
History and Physical   Kavion Mancinas YKD:[id number] DOB: Apr 25, 1990 DOA: 10/16/2021  PCP: Wendall Mola, MD   Patient coming from: Home  Chief Complaint: Cough, congestion  HPI: Parmvir Boomer is a 31 y.o. male with medical history significant of stroke, obesity, hypertension, GERD, OCD, ADHD, hyperlipidemia, depression, asthma, OSA, diabetes presenting with cough and congestion.  Patient has been experiencing symptoms of cough and congestion for the past 2 weeks.  Has been productive of a black to brownish thick substance.  At home he has tried Mucinex and Coricidin without any relief.  He also is reporting some sore throat but is able to tolerate p.o.  He has been tested for COVID twice and has been negative both times.  For the past 4 days he has had increasing concern and worry about this problem and has had decreased sleep.  He has been off his Abilify for the past 3 weeks.  He also noted to have some episodes the last few days where he thinks it is the 1980s and someone is watching him per his family.  This is not persistent and he is aware that it has happened.  He denies fevers, shortness of breath, chills, chest pain, abdominal pain, constipation, diarrhea, nausea, vomiting.  ED Course: Vital signs in the ED significant for blood pressure in the 80s initially which improved to the 110s to 120s with a liter of fluids.  Heart rate in the 120s improved to the 110s with fluids as well.  Lab work-up included CMP with sodium 126, chloride 92, bicarb 19, BUN 45, creatinine elevated 2.67 from baseline of 1.01, glucose 111, protein 8.4, AST 129, ALT 87, T. bili 1.3.  CBC with leukocytosis to 12.8.  Troponin negative x2.  Lactic acid normal.  BNP normal.  D-dimer mildly elevated 1.39.  COVID screen negative in the ED as well.  Urinalysis pending.  VBG with normal pH and mildly low PCO2 at 36.  Chest x-ray showed no acute abnormality.  CT of the chest showed patchy central lobar micronodularity  consistent with nonspecific infectious versus inflammatory bronchiolitis.  Hepatic steatosis also noted in the upper abdomen.  CT of the head showed no acute abnormality but did demonstrate chronic changes from previous stroke.  Bilateral DVT study was negative.  Patient received a dose of Levaquin and a liter of IV fluids in the ED.  Review of Systems: As per HPI otherwise all other systems reviewed and are negative.  Past Medical History:  Diagnosis Date   GERD (gastroesophageal reflux disease)    Hypertension    OCD (obsessive compulsive disorder)     History reviewed. No pertinent surgical history.  Social History  reports that he has never smoked. He has never used smokeless tobacco. He reports that he does not currently use alcohol. He reports that he does not use drugs.  Allergies  Allergen Reactions   Amoxicillin Diarrhea, Nausea Only and Anaphylaxis    Total body discomfort Total body discomfort    Cat Hair Extract Shortness Of Breath   Doxycycline Anaphylaxis and Anxiety    Total body discomfort, Stomach inflammation    Pecan Nut (Diagnostic) Anaphylaxis   Telmisartan Other (See Comments)    headache   Montelukast     Other reaction(s): Confusion (intolerance)    Family History  Problem Relation Age of Onset   Lupus Mother   Reviewed on admission  Prior to Admission medications   Medication Sig Start Date End Date Taking? Authorizing Provider  ALPRAZolam Prudy Feeler) 0.25  MG tablet Take 1-2 tabs (0.25mg -0.50mg ) 30-60 minutes before procedure. May repeat if needed.Do not drive. 10/22/19   Melvenia Beam, MD  ARIPiprazole (ABILIFY) 15 MG tablet Take 15 mg by mouth daily.  09/08/19   [provider]  aspirin EC 81 MG EC tablet Take 1 tablet (81 mg total) by mouth daily. Swallow whole. 09/20/19   Regalado, Belkys A, MD  atorvastatin (LIPITOR) 80 MG tablet Take 1 tablet (80 mg total) by mouth daily. 09/20/19   Regalado, Belkys A, MD  CONCERTA 54 MG CR tablet Take 54  mg by mouth daily. 10/10/19   [provider]  DEXILANT 60 MG capsule Take 60 mg by mouth daily. 08/19/19   [provider]  FLOVENT HFA 220 MCG/ACT inhaler Inhale 2 puffs into the lungs 2 (two) times daily. 08/28/19   [provider]  fluticasone (FLONASE) 50 MCG/ACT nasal spray Place 2 sprays into both nostrils as needed for allergies or rhinitis.    [provider]  fluvoxaMINE (LUVOX) 100 MG tablet Take 150 mg by mouth daily.  Patient not taking: Reported on 10/22/2019 09/05/19   [provider]  hydrOXYzine (VISTARIL) 25 MG capsule Take by mouth 2 (two) times daily as needed.  10/10/19   [provider]  lisinopril (ZESTRIL) 20 MG tablet Take 20 mg by mouth 2 (two) times daily.    [provider]  metFORMIN (GLUCOPHAGE) 500 MG tablet Take 1 tablet (500 mg total) by mouth 2 (two) times daily with a meal. 09/20/19 09/19/20  Elmarie Shiley, MD    Physical Exam: Vitals:   10/16/21 1608 10/16/21 1630 10/16/21 1700 10/16/21 1851  BP: 101/66 (!) 112/49 116/75 (!) 117/54  Pulse: (!) 114 (!) 112 (!) 114 (!) 114  Resp: 20   20  Temp: 98.1 F (36.7 C)     TempSrc:      SpO2: 100% 100% 99% 98%  Weight:      Height:        Physical Exam Constitutional:      General: He is not in acute distress.    Appearance: Normal appearance. He is obese.  HENT:     Head: Normocephalic and atraumatic.     Mouth/Throat:     Mouth: Mucous membranes are moist.     Pharynx: Oropharynx is clear.     Comments: Hoarse speech Eyes:     Extraocular Movements: Extraocular movements intact.     Pupils: Pupils are equal, round, and reactive to light.  Cardiovascular:     Rate and Rhythm: Regular rhythm. Tachycardia present.     Pulses: Normal pulses.     Heart sounds: Normal heart sounds.  Pulmonary:     Effort: Pulmonary effort is normal. No respiratory distress.     Breath sounds: Normal breath sounds.  Abdominal:     General: Bowel sounds are  normal. There is no distension.     Palpations: Abdomen is soft.     Tenderness: There is no abdominal tenderness.  Musculoskeletal:        General: No swelling or deformity.  Skin:    General: Skin is warm and dry.  Neurological:     General: No focal deficit present.     Mental Status: Mental status is at baseline.    Labs on Admission: I have personally reviewed following labs and imaging studies  CBC: Recent Labs  Lab 10/16/21 1422 10/16/21 1430  WBC 12.8*  --   NEUTROABS 8.3*  --  HGB 13.6 14.3  HCT 39.3 42.0  MCV 79.4*  --   PLT 383  --     Basic Metabolic Panel: Recent Labs  Lab 10/16/21 1422 10/16/21 1430  NA 126* 128*  K 3.7 3.8  CL 92*  --   CO2 19*  --   GLUCOSE 111*  --   BUN 45*  --   CREATININE 2.67*  --   CALCIUM 9.2  --     GFR: Estimated Creatinine Clearance: 59.5 mL/min (A) (by C-G formula based on SCr of 2.67 mg/dL (H)).  Liver Function Tests: Recent Labs  Lab 10/16/21 1422  AST 129*  ALT 87*  ALKPHOS 71  BILITOT 1.3*  PROT 8.4*  ALBUMIN 4.2    Urine analysis:    Component Value Date/Time   COLORURINE YELLOW 09/18/2019 1406   APPEARANCEUR CLEAR 09/18/2019 1406   LABSPEC 1.015 09/18/2019 1406   PHURINE 6.0 09/18/2019 1406   GLUCOSEU >=500 (A) 09/18/2019 1406   HGBUR NEGATIVE 09/18/2019 1406   BILIRUBINUR NEGATIVE 09/18/2019 1406   KETONESUR NEGATIVE 09/18/2019 1406   PROTEINUR NEGATIVE 09/18/2019 1406   NITRITE NEGATIVE 09/18/2019 1406   LEUKOCYTESUR NEGATIVE 09/18/2019 1406    Radiological Exams on Admission: US Venous Img Lower Bilateral  Result Date: 10/16/2021 CLINICAL DATA:  Elevated D-dimer. EXAM: BILATERAL LOWER EXTREMITY VENOUS DOPPLER ULTRASOUND TECHNIQUE: Gray-scale sonography with compression, as well as color and duplex ultrasound, were performed to evaluate the deep venous system(s) from the level of the common femoral vein through the popliteal and proximal calf veins. COMPARISON:  None Available. FINDINGS:  VENOUS Normal compressibility of the common femoral, superficial femoral, and popliteal veins, as well as the visualized calf veins. Visualized portions of profunda femoral vein and great saphenous vein unremarkable. No filling defects to suggest DVT on grayscale or color Doppler imaging. Doppler waveforms show normal direction of venous flow, normal respiratory plasticity and response to augmentation. Limited views of the contralateral common femoral vein are unremarkable. OTHER None. Limitations: Left distended due to body habitus. IMPRESSION: Negative. Electronically Signed   By: Ted Mcalpine M.D.   On: 10/16/2021 17:48   CT Chest Wo Contrast  Result Date: 10/16/2021 CLINICAL DATA:  Chronic cough for 2 weeks, failed empiric treatment. EXAM: CT CHEST WITHOUT CONTRAST TECHNIQUE: Multidetector CT imaging of the chest was performed following the standard protocol without IV contrast. RADIATION DOSE REDUCTION: This exam was performed according to the departmental dose-optimization program which includes automated exposure control, adjustment of the mA and/or kV according to patient size and/or use of iterative reconstruction technique. COMPARISON:  Chest radiograph from earlier today. FINDINGS: Cardiovascular: Normal heart size. No significant pericardial effusion/thickening. Great vessels are normal in course and caliber. Mediastinum/Nodes: No significant thyroid nodules. Unremarkable esophagus. No pathologically enlarged axillary, mediastinal or hilar lymph nodes, noting limited sensitivity for the detection of hilar adenopathy on this noncontrast study. Lungs/Pleura: No pneumothorax. No pleural effusion. Patchy segmental centrilobular micronodularity in the right upper lobe (series 3/image 51). Otherwise clear lungs with no lung masses or significant pulmonary nodules. No consolidative airspace disease. Upper abdomen: Diffuse hepatic steatosis. Musculoskeletal: No aggressive appearing focal osseous  lesions. Mild lower thoracic spondylosis. IMPRESSION: 1. Patchy segmental centrilobular micronodularity in the right upper lobe, compatible with a nonspecific mild infectious or inflammatory bronchiolitis. 2. Diffuse hepatic steatosis. Electronically Signed   By: Delbert Phenix M.D.   On: 10/16/2021 15:33   CT Head Wo Contrast  Result Date: 10/16/2021 CLINICAL DATA:  Mental status change, unknown cause EXAM:  CT HEAD WITHOUT CONTRAST TECHNIQUE: Contiguous axial images were obtained from the base of the skull through the vertex without intravenous contrast. RADIATION DOSE REDUCTION: This exam was performed according to the departmental dose-optimization program which includes automated exposure control, adjustment of the mA and/or kV according to patient size and/or use of iterative reconstruction technique. COMPARISON:  09/18/2019, 09/19/2019 FINDINGS: Brain: No evidence of acute infarction, hemorrhage, hydrocephalus, extra-axial collection or mass lesion/mass effect. 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 Vascular: No hyperdense vessel or unexpected calcification. Skull: Normal. Negative for fracture or focal lesion. Sinuses/Orbits: No acute finding. Other: None. IMPRESSION: 1. No acute intracranial abnormality. 2. 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 Electronically Signed   By: Duanne GuessNicholas  Plundo D.O.   On: 10/16/2021 15:32   DG Chest Portable 1 View  Result Date: 10/16/2021 CLINICAL DATA:  Cough and congestion for 2 weeks. EXAM: PORTABLE CHEST 1 VIEW COMPARISON:  None Available. FINDINGS: The heart size and mediastinal contours are within normal limits. Both lungs are clear. The visualized skeletal structures are unremarkable. IMPRESSION: No active disease. Electronically Signed   By: Ted Mcalpineobrinka  Dimitrova M.D.   On: 10/16/2021 13:54    EKG: Independently reviewed.  Sinus tachycardia 116 bpm.  QTc prolonged at 496.  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 intake but somewhat increased water intake. > Did have creatinine elevated to 2.61 from previous baseline of 1.01.  Representing likely prerenal with BUN of 45.  Also noted to have sodium of 126. > Did receive a liter of fluids in the ED due to initial hypotension which improved following that liter. - Monitor on telemetry - Recheck BMP to determine correction rate - Trend BMP every 4 hours overnight - Check serum osmolality, urine osmolality, urine sodium  Bronchiolitis > Patient with ongoing cough congestion with cough productive of dark sputum.  Negative for COVID with CT findings concerning for patchy central lobar micronodularity consistent with nonspecific infectious versus inflammatory bronchiolitis. > Started on Levaquin in the ED.  Does have leukocytosis 12.8.  Normal lactic acid. - Monitor on telemetry - We will continue Levaquin for now - Trend fever curve and WBC.  Positive D-dimer > With his respiratory symptoms D-dimer was checked in the ED and this noted to be positive.  Negative bilateral DVT studies.  However will likely need to rule out PE with VQ scan versus CT (if renal function improves) - Plan for likely PE rule out tomorrow  Asthma - Replace home Flovent with formulary Pulmicort  GERD - Continue home  PPI  OCD ADHD Depression > Has had decreased sleep the last few days due to excessive worry about his medical condition.  This is a likely trigger for his episodes of delusion where he think its 1980s and people are watching him. > Delusions are not persistent. - Restart home Abilify which she has been out of  for 3 weeks - Continue home Adderall - Continue home Celexa and Wellbutrin - Continue as needed hydroxyzine  Hyperlipidemia - Continue home atorvastatin  History of CVA - Continue home aspirin and atorvastatin  Hypertension - Holding home lisinopril in the setting of AKI and hypotension on presentation  Diabetes - SSI  Obesity Fatty liver Transaminitis > Patient with known history of obesity and fatty liver as well as known history of elevated liver enzymes.  DVT prophylaxis: Lovenox Code Status:   Full Family Communication:  None on admission. States wife has been with him and is on her way here. Disposition Plan:   Patient is from:  Home  Anticipated DC to:  Home  Anticipated DC date:  1 to 3 days  Anticipated DC barriers: None  Consults called:  None Admission status:  Observation, telemetry  Severity of Illness: The appropriate patient status for this patient is OBSERVATION. Observation status is judged to be reasonable and necessary in order to provide the required intensity of service to ensure the patient's safety. The patient's presenting symptoms, physical exam findings, and initial radiographic and laboratory data in the context of their medical condition is felt to place them at decreased risk for further clinical deterioration. Furthermore, it is anticipated that the patient will be medically stable for discharge from the hospital within 2 midnights of admission.    Synetta Fail MD [facility]  How to contact the [facility] Attending or Consulting provider 7A - 7P or covering provider during after hours 7P -7A, for this patient?   Check the care  team in [facility] and look for a) attending/consulting [facility] provider listed and b) the [facility] team listed Log into [email or web address] and use [place]'s universal password to access. If you do not have the password, please contact the hospital operator. Locate the [facility] provider you are looking for under [facility] and page to a number that you can be directly reached. If you still have difficulty reaching the provider, please page the [facility] (Director on Call) for the Hospitalists listed on amion for assistance.  10/16/2021, 7:26 PM

## 2021-10-16 NOTE — ED Notes (Signed)
Patient stated that he is unable to urinate at this time. 

## 2021-10-16 NOTE — ED Notes (Signed)
BBS clear but upper airway congestion noted. Stated he has been coughing up thick brown mucous for days. He appears very anxious, and stated it may be making his SOB worse.  RT to monitor.

## 2021-10-16 NOTE — ED Provider Notes (Signed)
[facility] EMERGENCY DEPARTMENT Provider Note   CSN: [id number] Arrival date & time: 10/16/21  1302     History  Chief Complaint  Patient presents with  . Cough    Bruce Cross is a 31 y.o. male.   Cough   31 year old male presents emergency department with complaints of cough, congestion for the past 2 weeks.  Patient states that his cough has been productive in nature coughing up brown/black thick substance.  Patient states he has been trying to take at home Mucinex as well as Coricidin with minimal to no relief of symptoms.  He states he tested negative for COVID twice over the past 2 weeks.  He reports not sleeping for the past 4 days over concern about his current medical condition.  He reports not taking his Abilify for the past 3 weeks.  Partner is with him in the room who reports patient has had episodes of delusional type activity believing that he is in the 1980s, people in his house watching him...  Partner states episodes are worse at night and seem to be better in the morning.  He has not had any episodes of delusional activity the past 1 day.  Patient states that he states he has been having a sore throat over the past 2 weeks.  He states has been able to tolerate oral foods and liquids without difficulty but states the soreness has been irritating to him.  Denies difficulty breathing.  Denies fever, chest pain, shortness of breath, abdominal pain, nausea, vomiting, urinary symptoms, changes in bowel habits.  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 Authorizing Provider  ALPRAZolam (XANAX) 0.25 MG tablet Take 1-2 tabs (0.25mg -0.50mg ) 30-60 minutes before procedure. May repeat if needed.Do not drive. 10/22/19   Anson Fret, MD  ARIPiprazole (ABILIFY) 15 MG tablet Take 15 mg by mouth daily.   09/08/19   [provider]  aspirin EC 81 MG EC tablet Take 1 tablet (81 mg total) by mouth daily. Swallow whole. 09/20/19   Regalado, Belkys A, MD  atorvastatin (LIPITOR) 80 MG tablet Take 1 tablet (80 mg total) by mouth daily. 09/20/19   Regalado, Belkys A, MD  CONCERTA 54 MG CR tablet Take 54 mg by mouth daily. 10/10/19   [provider]  DEXILANT 60 MG capsule Take 60 mg by mouth daily. 08/19/19   [provider]  FLOVENT HFA 220 MCG/ACT inhaler Inhale 2 puffs into the lungs 2 (two) times daily. 08/28/19   [provider]  fluticasone (FLONASE) 50 MCG/ACT nasal spray Place 2 sprays into both nostrils as needed for allergies or rhinitis.    [provider]  fluvoxaMINE (LUVOX) 100 MG tablet Take 150 mg by mouth daily.  Patient not taking: Reported on 10/22/2019 09/05/19   [provider]  hydrOXYzine (VISTARIL) 25 MG capsule Take by mouth 2 (two) times daily as needed.  10/10/19   [provider]  lisinopril (ZESTRIL) 20 MG tablet Take 20 mg by mouth 2 (two) times daily.    [provider]  metFORMIN (GLUCOPHAGE) 500 MG tablet Take 1 tablet (500 mg total) by mouth 2 (two) times daily with a meal. 09/20/19 09/19/20  Regalado, Belkys A, MD      Allergies    Amoxicillin, Cat hair extract, Doxycycline, Pecan nut (diagnostic), Telmisartan, and Montelukast    Review of  Systems   Review of Systems  Respiratory:  Positive for cough.     Physical Exam Updated Vital Signs BP (!) 117/54 (BP Location: Right Arm)   Pulse (!) 114   Temp 98.1 F (36.7 C)   Resp 20   Ht 5\' 11"  (1.803 m)   Wt (!) 154.2 kg   SpO2 98%   BMI 47.42 kg/m  Physical Exam Vitals and nursing note reviewed.  Constitutional:      General: He is not in acute distress.    Appearance: He is well-developed. He is obese. He is not ill-appearing.     Comments: Patient aware of previous delusional activity.  States that he has not currently experiencing any at this  time.  HENT:     Head: Normocephalic and atraumatic.     Right Ear: Tympanic membrane normal.     Left Ear: Tympanic membrane normal.     Nose: Congestion present.     Mouth/Throat:     Comments: Mild posterior pharyngeal erythema with no obvious exudate.  Tonsils 1-2+ bilaterally with no obvious exudate.  Uvula midline rises symmetrically with phonation.  No edema noticed beneath the tongue.  No submandibular edema noted. Eyes:     Extraocular Movements: Extraocular movements intact.     Conjunctiva/sclera: Conjunctivae normal.     Pupils: Pupils are equal, round, and reactive to light.  Neck:     Comments: Kernig and Brudzinski negative. Cardiovascular:     Rate and Rhythm: Regular rhythm. Tachycardia present.     Heart sounds: No murmur heard. Pulmonary:     Effort: Pulmonary effort is normal. No respiratory distress.     Breath sounds: Normal breath sounds. No stridor. No wheezing, rhonchi or rales.  Abdominal:     Palpations: Abdomen is soft.     Tenderness: There is no abdominal tenderness. There is no right CVA tenderness, left CVA tenderness or guarding.  Musculoskeletal:        General: No swelling.     Cervical back: Normal range of motion and neck supple. No rigidity or tenderness.     Right lower leg: No edema.     Left lower leg: No edema.  Skin:    General: Skin is warm and dry.     Capillary Refill: Capillary refill takes less than 2 seconds.  Neurological:     Mental Status: He is alert and oriented to person, place, and time.  Psychiatric:        Mood and Affect: Mood normal.     ED Results / Procedures / Treatments   Labs (all labs ordered are listed, but only abnormal results are displayed) Labs Reviewed  COMPREHENSIVE METABOLIC PANEL - Abnormal; Notable for the following components:      Result Value   Sodium 126 (*)    Chloride 92 (*)    CO2 19 (*)    Glucose, Bld 111 (*)    BUN 45 (*)    Creatinine, Ser 2.67 (*)    Total Protein 8.4 (*)    AST  129 (*)    ALT 87 (*)    Total Bilirubin 1.3 (*)    GFR, Estimated 31 (*)    All other components within normal limits  CBC WITH DIFFERENTIAL/PLATELET - Abnormal; Notable for the following components:   WBC 12.8 (*)    MCV 79.4 (*)    Neutro Abs 8.3 (*)    Monocytes Absolute 1.2 (*)    Abs Immature Granulocytes 0.08 (*)  All other components within normal limits  D-DIMER, QUANTITATIVE - Abnormal; Notable for the following components:   D-Dimer, Quant 1.39 (*)    All other components within normal limits  I-STAT VENOUS BLOOD GAS, ED - Abnormal; Notable for the following components:   pCO2, Ven 36.0 (*)    TCO2 21 (*)    Acid-base deficit 4.0 (*)    Sodium 128 (*)    Calcium, Ion 1.14 (*)    All other components within normal limits  SARS CORONAVIRUS 2 BY RT PCR  LACTIC ACID, PLASMA  BRAIN NATRIURETIC PEPTIDE  URINALYSIS, ROUTINE W REFLEX MICROSCOPIC  TROPONIN I (HIGH SENSITIVITY)  TROPONIN I (HIGH SENSITIVITY)    EKG None  Radiology US Venous Img Lower Bilateral  Result Date: 10/16/2021 CLINICAL DATA:  Elevated D-dimer. EXAM: BILATERAL LOWER EXTREMITY VENOUS DOPPLER ULTRASOUND TECHNIQUE: Gray-scale sonography with compression, as well as color and duplex ultrasound, were performed to evaluate the deep venous system(s) from the level of the common femoral vein through the popliteal and proximal calf veins. COMPARISON:  None Available. FINDINGS: VENOUS Normal compressibility of the common femoral, superficial femoral, and popliteal veins, as well as the visualized calf veins. Visualized portions of profunda femoral vein and great saphenous vein unremarkable. No filling defects to suggest DVT on grayscale or color Doppler imaging. Doppler waveforms show normal direction of venous flow, normal respiratory plasticity and response to augmentation. Limited views of the contralateral common femoral vein are unremarkable. OTHER None. Limitations: Left distended due to body habitus.  IMPRESSION: Negative. Electronically Signed   By: Ted Mcalpineobrinka  Dimitrova M.D.   On: 10/16/2021 17:48   CT Chest Wo Contrast  Result Date: 10/16/2021 CLINICAL DATA:  Chronic cough for 2 weeks, failed empiric treatment. EXAM: CT CHEST WITHOUT CONTRAST TECHNIQUE: Multidetector CT imaging of the chest was performed following the standard protocol without IV contrast. RADIATION DOSE REDUCTION: This exam was performed according to the departmental dose-optimization program which includes automated exposure control, adjustment of the mA and/or kV according to patient size and/or use of iterative reconstruction technique. COMPARISON:  Chest radiograph from earlier today. FINDINGS: Cardiovascular: Normal heart size. No significant pericardial effusion/thickening. Great vessels are normal in course and caliber. Mediastinum/Nodes: No significant thyroid nodules. Unremarkable esophagus. No pathologically enlarged axillary, mediastinal or hilar lymph nodes, noting limited sensitivity for the detection of hilar adenopathy on this noncontrast study. Lungs/Pleura: No pneumothorax. No pleural effusion. Patchy segmental centrilobular micronodularity in the right upper lobe (series 3/image 51). Otherwise clear lungs with no lung masses or significant pulmonary nodules. No consolidative airspace disease. Upper abdomen: Diffuse hepatic steatosis. Musculoskeletal: No aggressive appearing focal osseous lesions. Mild lower thoracic spondylosis. IMPRESSION: 1. Patchy segmental centrilobular micronodularity in the right upper lobe, compatible with a nonspecific mild infectious or inflammatory bronchiolitis. 2. Diffuse hepatic steatosis. Electronically Signed   By: Delbert PhenixJason A Poff M.D.   On: 10/16/2021 15:33   CT Head Wo Contrast  Result Date: 10/16/2021 CLINICAL DATA:  Mental status change, unknown cause EXAM: CT HEAD WITHOUT CONTRAST TECHNIQUE: Contiguous axial images were obtained from the base of the skull through the vertex without  intravenous contrast. RADIATION DOSE REDUCTION: This exam was performed according to the departmental dose-optimization program which includes automated exposure control, adjustment of the mA and/or kV according to patient size and/or use of iterative reconstruction technique. COMPARISON:  09/18/2019, 09/19/2019 FINDINGS: Brain: No evidence of acute infarction, hemorrhage, hydrocephalus, extra-axial collection or mass lesion/mass effect. 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 Vascular: No hyperdense vessel  or unexpected calcification. Skull: Normal. Negative for fracture or focal lesion. Sinuses/Orbits: No acute finding. Other: None. IMPRESSION: 1. No acute intracranial abnormality. 2. 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 Electronically Signed   By: Duanne Guess D.O.   On: 10/16/2021 15:32   DG Chest Portable 1 View  Result Date: 10/16/2021 CLINICAL DATA:  Cough and congestion for 2 weeks. EXAM: PORTABLE CHEST 1 VIEW COMPARISON:  None Available. FINDINGS: The heart size and mediastinal contours are within normal limits. Both lungs are clear. The visualized skeletal structures are unremarkable. IMPRESSION: No active disease. Electronically Signed   By: Ted Mcalpine M.D.   On: 10/16/2021 13:54    Procedures Procedures    Medications Ordered in ED Medications  lactated ringers bolus 1,000 mL (0 mLs Intravenous Stopped 10/16/21 1606)  levofloxacin (LEVAQUIN) IVPB 750 mg (750 mg Intravenous New Bag/Given 10/16/21 1613)    ED Course/ Medical Decision Making/ A&P Clinical Course as of 10/16/21 1859  Sat Oct 16, 2021  1853 Consulted hospital medicine who agreed with admission of the patient and assume further treatment/care. [CR]  1855 [facility]: 27.5 [CR]    Clinical Course User Index [CR] Peter Garter, PA                           Medical Decision Making Amount and/or Complexity of Data Reviewed Labs: ordered. Radiology:  ordered.  Risk Prescription drug management. Decision regarding hospitalization.   This patient presents to the ED for concern of cough/congestion, this involves an extensive number of treatment options, and is a complaint that carries with it a high risk of complications and morbidity.  The differential diagnosis includes pneumonia, meningitis, pulmonary embolism, sepsis, COPD/asthma exacerbation, malignancy   Co morbidities that complicate the patient evaluation  See HPI   Additional history obtained:  Additional history obtained from EMR External records from outside source obtained and reviewed including prior GFR of greater than greater than 90 with BUN/creatinine ratio of 19.0 -comparing patient's current renal function to prior.   Lab Tests:  I Ordered, and personally interpreted labs.  The pertinent results include: Leukocytosis of 12.8 confirming clinical suspicion of sepsis..  No evidence of anemia.  Platelets within normal range.  Patient had hyponatremia with a sodium of 126; patient was given 1 L normal saline.  Chloride of 92 with bicarb of 19.  Patient had AKI with evidence of GFR 41, BUN of 45 and creatinine 2.67.  Patient had transaminitis with an AST of 129 and ALT of 87 with total bili of 1.3; patient not tender to palpation of the abdomen.   Imaging Studies ordered:  I ordered imaging studies including bilateral venous ultrasound, CT chest without contrast, CT head without contrast, chest x-ray I independently visualized and interpreted imaging which showed  Bilateral venous ultrasound: CT chest: Patchy segmental central lobar micronodularity in the right upper lobe compatible with nonspecific mild infectious inflammatory bronchiolitis.  Diffuse hepatic steatosis. CT head: No acute intracranial abnormality.  Left temporal occipital encephalomalacia at site of prior infarct. Chest x-ray: No acute cardiopulmonary abnormalities I agree with the radiologist  interpretation  Cardiac Monitoring: / EKG:  The patient was maintained on a cardiac monitor.  I personally viewed and interpreted the cardiac monitored which showed an underlying rhythm of: Sinus tachycardia with a rate of 116 with QTc prolongation of 496   Consultations Obtained:  See ED course  Problem List / ED Course / Critical interventions /  Medication management  Community-acquired pneumonia, AKI, elevated D-dimer I ordered medication including 1 L normal saline for rehydration as well as Levaquin for community-acquired pneumonia coverage given that he has allergy to cephalosporins.    Reevaluation of the patient after these medicines showed that the patient improved I have reviewed the patients home medicines and have made adjustments as needed   Social Determinants of Health:  Former Hotel manager with known PTSD.  Denies tobacco, illicit drug use   Test / Admission - Considered:  Community-acquired pneumonia, AKI, elevated today department with concerns for pulmonary embolism Vitals signs significant for patient persistently tachycardic throughout hospital visit despite fluid administration as well as time labs.  Concern for possible pulmonary embolism given elevated D-dimer as well as tachycardia.  CTA of the chest cannot be performed at this time secondary to acute kidney injury.  Lower extremity venous ultrasound was performed for potential DVT. Otherwise within normal range and stable throughout visit. Laboratory/imaging studies significant for: See above Patient has multiple medical complications currently.  Empiric community-acquired pneumonia coverage given via form of Levaquin which was dose adjusted for patient's current AKI.  Patient to be transferred for VQ scan for assessment of possible pulmonary embolism.  Admission to hospital deemed most necessary given electrolyte abnormalities, new AKI, evidence of pneumonia as well as possibility of D-dimer.  Treatment plan was  discussed at length with patient/family and they acknowledge understanding and are agreeable to said plan.  Proper consultations were made while in the emergency department.  Patient was stable upon admission to hospital.          Final Clinical Impression(s) / ED Diagnoses Final diagnoses:  Community acquired pneumonia, unspecified laterality  AKI (acute kidney injury) (HCC)  D-dimer, elevated  Sepsis, due to unspecified organism, unspecified whether acute organ dysfunction present [facility])    Rx / DC Orders ED Discharge Orders     None         Peter Garter, [place] 10/16/21 1859    Mardene Sayer, MD 10/16/21 [facility] [place]

## 2021-10-16 NOTE — Progress Notes (Signed)
Report received from Terri Piedra, RN at [facility].

## 2021-10-16 NOTE — ED Triage Notes (Addendum)
Patient in with c/o cough and congestion x 2 weeks. Visitor reports patient with history of OCD and PTSD, patient does not sleep.

## 2021-10-16 NOTE — ED Notes (Signed)
Report given to [facility]  

## 2021-10-17 DIAGNOSIS — N179 Acute kidney failure, unspecified: Secondary | ICD-10-CM | POA: Diagnosis not present

## 2021-10-17 LAB — BASIC METABOLIC PANEL
Anion gap: 12 (ref 5–15)
Anion gap: 13 (ref 5–15)
BUN: 33 mg/dL — ABNORMAL HIGH (ref 6–20)
BUN: 41 mg/dL — ABNORMAL HIGH (ref 6–20)
CO2: 21 mmol/L — ABNORMAL LOW (ref 22–32)
CO2: 21 mmol/L — ABNORMAL LOW (ref 22–32)
Calcium: 9.2 mg/dL (ref 8.9–10.3)
Calcium: 9.2 mg/dL (ref 8.9–10.3)
Chloride: 95 mmol/L — ABNORMAL LOW (ref 98–111)
Chloride: 96 mmol/L — ABNORMAL LOW (ref 98–111)
Creatinine, Ser: 1.37 mg/dL — ABNORMAL HIGH (ref 0.61–1.24)
Creatinine, Ser: 1.76 mg/dL — ABNORMAL HIGH (ref 0.61–1.24)
GFR, Estimated: 52 mL/min — ABNORMAL LOW (ref >60)
GFR, Estimated: >60 mL/min (ref >60)
Glucose, Bld: 109 mg/dL — ABNORMAL HIGH (ref 70–99)
Glucose, Bld: 118 mg/dL — ABNORMAL HIGH (ref 70–99)
Potassium: 3.6 mmol/L (ref 3.5–5.1)
Potassium: 3.6 mmol/L (ref 3.5–5.1)
Sodium: 128 mmol/L — ABNORMAL LOW (ref 135–145)
Sodium: 130 mmol/L — ABNORMAL LOW (ref 135–145)

## 2021-10-17 LAB — CBC
HCT: 39.7 % (ref 39.0–52.0)
Hemoglobin: 13.1 g/dL (ref 13.0–17.0)
MCH: 27.3 pg (ref 26.0–34.0)
MCHC: 33 g/dL (ref 30.0–36.0)
MCV: 82.7 fL (ref 80.0–100.0)
Platelets: 279 10*3/uL (ref 150–400)
RBC: 4.8 MIL/uL (ref 4.22–5.81)
RDW: 13.9 % (ref 11.5–15.5)
WBC: 8.3 10*3/uL (ref 4.0–10.5)
nRBC: 0 % (ref 0.0–0.2)

## 2021-10-17 LAB — URINALYSIS, ROUTINE W REFLEX MICROSCOPIC
Bilirubin Urine: NEGATIVE
Glucose, UA: NEGATIVE mg/dL
Hgb urine dipstick: NEGATIVE
Ketones, ur: NEGATIVE mg/dL
Leukocytes,Ua: NEGATIVE
Nitrite: NEGATIVE
Protein, ur: NEGATIVE mg/dL
Specific Gravity, Urine: 1.006 (ref 1.005–1.030)
pH: 5 (ref 5.0–8.0)

## 2021-10-17 LAB — OSMOLALITY: Osmolality: 276 mOsm/kg (ref 275–295)

## 2021-10-17 LAB — HIV ANTIBODY (ROUTINE TESTING W REFLEX): HIV Screen 4th Generation wRfx: NONREACTIVE

## 2021-10-17 LAB — SODIUM, URINE, RANDOM: Sodium, Ur: <10 mmol/L

## 2021-10-17 LAB — OSMOLALITY, URINE: Osmolality, Ur: 235 mOsm/kg — ABNORMAL LOW (ref 300–900)

## 2021-10-17 LAB — GLUCOSE, CAPILLARY
Glucose-Capillary: 108 mg/dL — ABNORMAL HIGH (ref 70–99)
Glucose-Capillary: 125 mg/dL — ABNORMAL HIGH (ref 70–99)

## 2021-10-17 MED ORDER — AZITHROMYCIN 250 MG PO TABS: ORAL_TABLET | ORAL | 0 refills | Status: AC

## 2021-10-17 MED ORDER — TRAZODONE HCL 50 MG PO TABS: 50.0000 mg | ORAL_TABLET | Freq: Every day | ORAL | 0 refills | Status: AC

## 2021-10-17 MED ORDER — MENTHOL 3 MG MT LOZG
1.0000 | LOZENGE | OROMUCOSAL | Status: DC | PRN
  Administered 2021-10-17: 3 mg via ORAL
  Filled 2021-10-17: qty 9

## 2021-10-17 NOTE — Hospital Course (Signed)
Mr. Bordon is a 31 y.o. M with obesity, OSA, asthma, DM, hx CVA, HTN, OCD/ADHD, and HLD who presented with few weeks cough and congestion.   In the ER, COVID negative.  CT chest showed bronchiolitis.  Cr 2.67 from baseline 1.01.  Started on fluids and admitted for AKI, bronchiolitis.     9/23: Admitted on fluids, antibiotics

## 2021-10-17 NOTE — Progress Notes (Signed)
Reviewed discharge paperwork with patient, mother, & wife. Went over medication regimen & changes. Advised where to pick up prescription & the need to follow up with PCP by the end of the week. Patient was able to ambulate on his on to discharge.

## 2021-10-17 NOTE — Discharge Summary (Signed)
Physician Discharge Summary   Patient: Bruce Cross MRN: [id number] DOB: 25-May-1990  Admit date:     10/16/2021  Discharge date: 10/17/21  Discharge Physician: Alberteen Sam   PCP: Wendall Mola, MD     Recommendations at discharge:  Follow up with PCP Dr. Montez Morita at upcoming appointment  Discharge Scr 1.3, Na 130 Dr. Montez Morita: Please check creatinine and sodium this Thursday Dr. Montez Morita: Please resume lisinopril as indicated Defer work up of transaminitis to Dr. Earma Reading held at discharge, defer timing of resumption vs. alternate therapy to Dr. 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 Bruce Cross is a 31 y.o. M with obesity, OSA, asthma, DM, hx CVA, HTN, OCD/ADHD, and HLD who presented with few weeks cough and congestion.   In the ER, COVID negative.  CT chest showed bronchiolitis.  Cr 2.67 from baseline 1.01.  Started on fluids and admitted for AKI, bronchiolitis.       AKI (acute kidney injury)  Cr 2.67 on admission in setting of poor PO intake, lisinopril use, hypotension on admission.  Given fluids, Cr improved to 1.3.  Patient BP normal, taking PO very well, strong preference for home treatment and excellent outpatient follow up in place, safe for d/c.   Hyponatremia Na 126 in setting of hypovolemia on admission.  Improved to 132 with fluids.  UNa low and  clinically dry, suspect this is all hypovolemic. - Push fluids and repeat with PCP later this week   Bronchiolitis, suspected bronchopneumonia vs ABRS Moderate persistent asthma without complication Patient with typical URI symptoms 2 weeks ago, now persistent sinusitis, sore throat/post-nasal drip, worsening productive cough.  Sounds like acute bacterial sinusitis, maybe some early pneumonia.  Levaquin in the hospital.  Pen allergic.  Intolerant to doxy.  Given azithromycin 5 days at discharge.   Transaminitis, suspect fatty liver in setting of Abilify This appears to be new in [facility], as of August.  Maybe NASH in setting of metabolic syndrome, maybe exacerbated by Abilify?  Defer US imaging and follow up of this to PCP.    History of CVA (cerebrovascular accident) Obesity, morbid, BMI 50 or higher (HCC) Hypertension Mixed hyperlipidemia Type 2 diabetes mellitus Metabolic syndrome, well managed by PCP.  Discussed holding GLP-1 agonist given vomiting, dehydration/AKI on admission.  To resume or switch therapy at discretion of PCP.    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  Very low clinical suspicion for VTE.             [facility] Controlled Substances Registry was reviewed for this patient prior to discharge.     Disposition: Home   DISCHARGE MEDICATION: Allergies as of 10/17/2021       Reactions   Amoxicillin Anaphylaxis, Diarrhea, Nausea Only, Other (See Comments)   "Total body discomfort" and GI upset, also  Cat Hair Extract Shortness Of Breath   Doxycycline Anaphylaxis, Anxiety, Other (See Comments)   Total body discomfort and Stomach inflammation, also   Pecan Nut (diagnostic) Anaphylaxis, Swelling, Other (See Comments)   "Pharyngeal swelling"   Telmisartan Other (See Comments)   Headache   Sertraline Hypertension   Montelukast Other (See  Comments)   Confusion   Semaglutide Diarrhea, Nausea And Vomiting        Medication List     STOP taking these medications    amphetamine-dextroamphetamine 30 MG tablet Commonly known as: ADDERALL   Concerta 54 MG CR tablet Generic drug: methylphenidate   Dexilant 60 MG capsule Generic drug: dexlansoprazole   Flovent HFA 220 MCG/ACT inhaler Generic drug: fluticasone   hydrOXYzine 25 MG capsule Commonly known as: VISTARIL   lisinopril 20 MG tablet Commonly known as: ZESTRIL   Mounjaro 5 MG/0.5ML Pen Generic drug: tirzepatide       TAKE these medications    albuterol 108 (90 Base) MCG/ACT inhaler Commonly known as: VENTOLIN HFA Inhale 2 puffs into the lungs every 6 (six) hours as needed for wheezing or shortness of breath.   ARIPiprazole 30 MG tablet Commonly known as: ABILIFY Take 30 mg by mouth daily. What changed: Another medication with the same name was removed. Continue taking this medication, and follow the directions you see here.   aspirin EC 81 MG tablet Take 1 tablet (81 mg total) by mouth daily. Swallow whole.   atorvastatin 80 MG tablet Commonly known as: LIPITOR Take 1 tablet (80 mg total) by mouth daily. What changed: when to take this   azithromycin 250 MG tablet Commonly known as: Zithromax Z-Pak Take 2 tablets (500 mg) on  Day 1,  followed by 1 tablet (250 mg) once daily on Days 2 through 5.   buPROPion 300 MG 24 hr tablet Commonly known as: WELLBUTRIN XL Take 300 mg by mouth daily.   citalopram 20 MG tablet Commonly known as: CELEXA Take 20 mg by mouth daily.   levocetirizine 5 MG tablet Commonly known as: XYZAL Take 5 mg by mouth daily as needed for allergies.   metFORMIN 500 MG 24 hr tablet Commonly known as: GLUCOPHAGE-XR Take 1,000 mg by mouth 2 (two) times daily with a meal.   pantoprazole 40 MG tablet Commonly known as: PROTONIX Take 40 mg by mouth 2 (two) times daily before a meal.        Follow-up Information      Wendall Molaarter, Jeremy, MD. Call.   Specialty: Family Medicine Why: To set up an appointment for labs later this week Contact information: [address] [place] [address] [phone]                 Discharge Instructions     Discharge instructions   Complete by: As directed    FROM DR. Viana Sleep: You were admitted for kidney injury and dehydration. This happened because of the fact that you couldn't eat, and was exacerbated by your blood pressure medicine lisinopril  For the next week: Push fluids. Drink 64 oz fluid daily for the next 3-4 days I like to have people measure this out in a pitcher in the morning, so they know through the day how much progress they've made and how much left they have to drink Avoid sugary drinks, caffeine and alcohol  FOR NOW: do not take your lisinopril IMPORTANT: Get your sodium and creatinine checked by Dr. Montez Moritaarter on Thursday of this week Call him tomorrow to  arrange that  After that, ask him when you should restart the lisinopril  For the cough, sore throat: Your CT scan may have shown a mild pneumonia Finishe the antibiotics with 3 more days Levaquin Take Levaquin 750 mg daily Mon, Tues, and Weds   Lastly, have Dr. Eulas Post recheck your liver function tests, to make sure these are stable, and continue any work up he has recommended  Before you take the [facility] again, talk with Dr. Eulas Post  Given you had a little hallucinations this week, probably avoid your Adderall until you are fully better.   Increase activity slowly   Complete by: As directed        Discharge Exam: Filed Weights   10/16/21 1323  Weight: (!) 154.2 kg    General: Pt is alert, awake, not in acute distress, lying in bed, anxious, but interactive and appropriate. Cardiovascular: RRR, nl S1-S2, no murmurs appreciated.   No LE edema.   Respiratory: Normal respiratory rate and rhythm.  CTAB without rales or wheezes. Abdominal: Abdomen soft and  non-tender.  No distension or HSM.   Neuro/Psych: Strength symmetric in upper and lower extremities.  Judgment and insight appear normal.   Condition at discharge: Good  The results of significant diagnostics from this hospitalization (including imaging, microbiology, ancillary and laboratory) are listed below for reference.   Imaging Studies: US Venous Img Lower Bilateral  Result Date: 10/16/2021 CLINICAL DATA:  Elevated D-dimer. EXAM: BILATERAL LOWER EXTREMITY VENOUS DOPPLER ULTRASOUND TECHNIQUE: Gray-scale sonography with compression, as well as color and duplex ultrasound, were performed to evaluate the deep venous system(s) from the level of the common femoral vein through the popliteal and proximal calf veins. COMPARISON:  None Available. FINDINGS: VENOUS Normal compressibility of the common femoral, superficial femoral, and popliteal veins, as well as the visualized calf veins. Visualized portions of profunda femoral vein and great saphenous vein unremarkable. No filling defects to suggest DVT on grayscale or color Doppler imaging. Doppler waveforms show normal direction of venous flow, normal respiratory plasticity and response to augmentation. Limited views of the contralateral common femoral vein are unremarkable. OTHER None. Limitations: Left distended due to body habitus. IMPRESSION: Negative. Electronically Signed   By: Fidela Salisbury M.D.   On: 10/16/2021 17:48   CT Chest Wo Contrast  Result Date: 10/16/2021 CLINICAL DATA:  Chronic cough for 2 weeks, failed empiric treatment. EXAM: CT CHEST WITHOUT CONTRAST TECHNIQUE: Multidetector CT imaging of the chest was performed following the standard protocol without IV contrast. RADIATION DOSE REDUCTION: This exam was performed according to the departmental dose-optimization program which includes automated exposure control, adjustment of the mA and/or kV according to patient size and/or use of iterative reconstruction technique.  COMPARISON:  Chest radiograph from earlier today. FINDINGS: Cardiovascular: Normal heart size. No significant pericardial effusion/thickening. Great vessels are normal in course and caliber. Mediastinum/Nodes: No significant thyroid nodules. Unremarkable esophagus. No pathologically enlarged axillary, mediastinal or hilar lymph nodes, noting limited sensitivity for the detection of hilar adenopathy on this noncontrast study. Lungs/Pleura: No pneumothorax. No pleural effusion. Patchy segmental centrilobular micronodularity in the right upper lobe (series 3/image 51). Otherwise clear lungs with no lung masses or significant pulmonary nodules. No consolidative airspace disease. Upper abdomen: Diffuse hepatic steatosis. Musculoskeletal: No aggressive appearing focal osseous lesions. Mild lower thoracic spondylosis. IMPRESSION: 1. Patchy segmental centrilobular micronodularity in the right upper lobe, compatible with a nonspecific mild infectious or inflammatory bronchiolitis. 2. Diffuse hepatic steatosis. Electronically Signed   By: Janina Mayo.D.  On: 10/16/2021 15:33   CT Head Wo Contrast  Result Date: 10/16/2021 CLINICAL DATA:  Mental status change, unknown cause EXAM: CT HEAD WITHOUT CONTRAST TECHNIQUE: Contiguous axial images were obtained from the base of the skull through the vertex without intravenous contrast. RADIATION DOSE REDUCTION: This exam was performed according to the departmental dose-optimization program which includes automated exposure control, adjustment of the mA and/or kV according to patient size and/or use of iterative reconstruction technique. COMPARISON:  09/18/2019, 09/19/2019 FINDINGS: Brain: No evidence of acute infarction, hemorrhage, hydrocephalus, extra-axial collection or mass lesion/mass effect. 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 Vascular: No hyperdense vessel or unexpected calcification. Skull: Normal. Negative for fracture or focal lesion.  Sinuses/Orbits: No acute finding. Other: None. IMPRESSION: 1. No acute intracranial abnormality. 2. 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 Electronically Signed   By: Duanne Guess D.O.   On: 10/16/2021 15:32   DG Chest Portable 1 View  Result Date: 10/16/2021 CLINICAL DATA:  Cough and congestion for 2 weeks. EXAM: PORTABLE CHEST 1 VIEW COMPARISON:  None Available. FINDINGS: The heart size and mediastinal contours are within normal limits. Both lungs are clear. The visualized skeletal structures are unremarkable. IMPRESSION: No active disease. Electronically Signed   By: Ted Mcalpine M.D.   On: 10/16/2021 13:54    Microbiology: Results for orders placed or performed during the hospital encounter of 10/16/21  SARS Coronavirus 2 by RT PCR (hospital order, performed in [facility] hospital lab) *cepheid single result test* Anterior Nasal Swab     Status: None   Collection Time: 10/16/21  1:21 PM   Specimen: Anterior Nasal Swab  Result Value Ref Range Status   SARS Coronavirus 2 by RT PCR NEGATIVE NEGATIVE Final    Comment: (NOTE) SARS-CoV-2 target nucleic acids are NOT DETECTED.  The SARS-CoV-2 RNA is generally detectable in upper and lower respiratory specimens during the acute phase of infection. The lowest concentration of SARS-CoV-2 viral copies this assay can detect is 250 copies / mL. A negative result does not preclude SARS-CoV-2 infection and should not be used as the sole basis for treatment or other patient management decisions.  A negative result may occur with improper specimen collection / handling, submission of specimen other than nasopharyngeal swab, presence of viral mutation(s) within the areas targeted by this assay, and inadequate number of viral copies (<250 copies / mL). A negative result must be combined with clinical observations, patient history, and epidemiological information.  Fact Sheet for Patients:    RoadLapTop.co.za  Fact Sheet for Healthcare Providers: http://kim-miller.com/  This test is not yet approved or  cleared by the [place] FDA and has been authorized for detection and/or diagnosis of SARS-CoV-2 by FDA under an Emergency Use Authorization (EUA).  This EUA will remain in effect (meaning this test can be used) for the duration of the COVID-19 declaration under Section 564(b)(1) of the Act, 21 U.S.C. section 360bbb-3(b)(1), unless the authorization is terminated or revoked sooner.  Performed at [facility], [address] [place], [place] [address]     Labs: CBC: Recent Labs  Lab 10/16/21 1422 10/16/21 1430 10/17/21 0551  WBC 12.8*  --  8.3  NEUTROABS 8.3*  --   --   HGB 13.6 14.3 13.1  HCT 39.3 42.0 39.7  MCV 79.4*  --  82.7  PLT 383  --  279   Basic Metabolic Panel: Recent Labs  Lab 10/16/21 1422 10/16/21 1430 10/16/21 2030 10/17/21 0018 10/17/21  0551  NA 126* 128* 128* 128* 130*  K 3.7 3.8 3.6 3.6 3.6  CL 92*  --  94* 95* 96*  CO2 19*  --  22 21* 21*  GLUCOSE 111*  --  97 118* 109*  BUN 45*  --  46* 41* 33*  CREATININE 2.67*  --  2.03* 1.76* 1.37*  CALCIUM 9.2  --  9.2 9.2 9.2   Liver Function Tests: Recent Labs  Lab 10/16/21 1422  AST 129*  ALT 87*  ALKPHOS 71  BILITOT 1.3*  PROT 8.4*  ALBUMIN 4.2   CBG: Recent Labs  Lab 10/16/21 2205 10/17/21 0727 10/17/21 1122  GLUCAP 114* 108* 125*    Discharge time spent: approximately 35 minutes spent on discharge counseling, evaluation of patient on day of discharge, and coordination of discharge planning with nursing, social work, pharmacy and case management  Signed: Edwin Dada, MD [facility] 10/17/2021

## 2022-01-23 ENCOUNTER — Telehealth: Payer: 59 | Admitting: Family

## 2022-01-23 DIAGNOSIS — J019 Acute sinusitis, unspecified: Secondary | ICD-10-CM

## 2022-01-23 MED ORDER — LEVOFLOXACIN 500 MG PO TABS: 500.0000 mg | ORAL_TABLET | Freq: Every day | ORAL | 0 refills | Status: AC

## 2022-01-23 NOTE — Progress Notes (Signed)
Virtual Visit Consent   [name], you are scheduled for a virtual visit with a [facility] provider today. Just as with appointments in the office, your consent must be obtained to participate. Your consent will be active for this visit and any virtual visit you may have with one of our providers in the next 365 days. If you have a MyChart account, a copy of this consent can be sent to you electronically.  As this is a virtual visit, video technology does not allow for your provider to perform a traditional examination. This may limit your provider's ability to fully assess your condition. If your provider identifies any concerns that need to be evaluated in person or the need to arrange testing (such as labs, EKG, etc.), we will make arrangements to do so. Although advances in technology are sophisticated, we cannot ensure that it will always work on either your end or our end. If the connection with a video visit is poor, the visit may have to be switched to a telephone visit. With either a video or telephone visit, we are not always able to ensure that we have a secure connection.  By engaging in this virtual visit, you consent to the provision of healthcare and authorize for your insurance to be billed (if applicable) for the services provided during this visit. Depending on your insurance coverage, you may receive a charge related to this service.  I need to obtain your verbal consent now. Are you willing to proceed with your visit today? Bruce Cross has provided verbal consent on 01/23/2022 for a virtual visit (video or telephone). [name], FNP  Date: 01/23/2022 4:07 PM  Virtual Visit via Video Note   I, [name], connected with  Bruce Cross  ([id number], 1990-09-17) on 01/23/22 at  4:00 PM EST by a video-enabled telemedicine application and verified that I am speaking with the correct person using two identifiers.  Location: Patient: Virtual Visit Location Patient:  Home Provider: Virtual Visit Location Provider: Home Office   I discussed the limitations of evaluation and management by telemedicine and the availability of in person appointments. The patient expressed understanding and agreed to proceed.    History of Present Illness: Bruce Cross is a 31 y.o. who identifies as a male who was assigned male at birth, and is being seen today for congestion and sinus pressure that started two weeks ago.  HPI: Sinus Problem This is a new problem. The current episode started 1 to 4 weeks ago. The problem has been gradually worsening since onset. There has been no fever. His pain is at a severity of 0/10. He is experiencing no pain. Associated symptoms include chills, congestion, coughing, headaches and a hoarse voice. Pertinent negatives include no diaphoresis, ear pain (congestion), sinus pressure, sneezing or sore throat. Past treatments include oral decongestants. The treatment provided mild relief.    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 10/16/2020   Mixed hyperlipidemia 10/16/2020   Obstructive sleep apnea syndrome 10/16/2020   Severe episode of recurrent major depressive disorder (HCC) 10/16/2020   Type 2 diabetes mellitus (HCC) 10/16/2020   Obesity, morbid, BMI 50 or higher (HCC) 09/19/2019   Hypertension    GERD (gastroesophageal reflux disease)    OCD (obsessive compulsive disorder)  History of CVA (cerebrovascular accident) 09/18/2019   Moderate persistent asthma without complication [id number]    Allergies:  Allergies  Allergen Reactions   Amoxicillin Anaphylaxis, Diarrhea, Nausea Only and Other (See Comments)    "Total body discomfort" and GI upset, also   Cat Hair Extract Shortness Of Breath    Doxycycline Anaphylaxis, Anxiety and Other (See Comments)    Total body discomfort and Stomach inflammation, also   Pecan Nut (Diagnostic) Anaphylaxis, Swelling and Other (See Comments)    "Pharyngeal swelling"   Telmisartan Other (See Comments)    Headache   Sertraline Hypertension   Montelukast Other (See Comments)    Confusion    Semaglutide Diarrhea and Nausea And Vomiting   Medications:  Current Outpatient Medications:    levofloxacin (LEVAQUIN) 500 MG tablet, Take 1 tablet (500 mg total) by mouth daily., Disp: 7 tablet, Rfl: 0   albuterol (VENTOLIN HFA) 108 (90 Base) MCG/ACT inhaler, Inhale 2 puffs into the lungs every 6 (six) hours as needed for wheezing or shortness of breath., Disp: , Rfl:    ARIPiprazole (ABILIFY) 30 MG tablet, Take 30 mg by mouth daily., Disp: , Rfl:    aspirin EC 81 MG EC tablet, Take 1 tablet (81 mg total) by mouth daily. Swallow whole., Disp: 30 tablet, Rfl: 11   atorvastatin (LIPITOR) 80 MG tablet, Take 1 tablet (80 mg total) by mouth daily. (Patient taking differently: Take 80 mg by mouth at bedtime.), Disp: 30 tablet, Rfl: 3   buPROPion (WELLBUTRIN XL) 300 MG 24 hr tablet, Take 300 mg by mouth daily., Disp: , Rfl:    citalopram (CELEXA) 20 MG tablet, Take 20 mg by mouth daily., Disp: , Rfl:    levocetirizine (XYZAL) 5 MG tablet, Take 5 mg by mouth daily as needed for allergies., Disp: , Rfl:    metFORMIN (GLUCOPHAGE-XR) 500 MG 24 hr tablet, Take 1,000 mg by mouth 2 (two) times daily with a meal., Disp: , Rfl:    pantoprazole (PROTONIX) 40 MG tablet, Take 40 mg by mouth 2 (two) times daily before a meal., Disp: , Rfl:    traZODone (DESYREL) 50 MG tablet, Take 1 tablet (50 mg total) by mouth at bedtime., Disp: 12 tablet, Rfl: 0  Observations/Objective: Patient is well-developed, well-nourished in no acute distress.  Resting comfortably  at home.  Head is normocephalic, atraumatic.  No labored breathing.  Speech is clear and coherent with logical content.   Patient is alert and oriented at baseline.  Nasal congestion  Assessment and Plan: 1. Acute sinusitis, recurrence not specified, unspecified location - levofloxacin (LEVAQUIN) 500 MG tablet; Take 1 tablet (500 mg total) by mouth daily.  Dispense: 7 tablet; Refill: 0  - Take meds as prescribed - Use a cool mist humidifier  -Use saline nose sprays frequently -Force fluids -For any cough or congestion  Use plain Mucinex- regular strength or max strength is fine -For fever or aces or pains- take tylenol or ibuprofen. -Throat lozenges if help -Follow up if symptoms worsen or do not improve   Follow Up Instructions: I discussed the assessment and treatment plan with the patient. The patient was provided an opportunity to ask questions and all were answered. The patient agreed with the plan and demonstrated an understanding of the instructions.  A copy of instructions were sent to the patient via MyChart unless otherwise noted below.     The patient was advised to call back or seek an in-person evaluation if the symptoms worsen or if the  condition fails to improve as anticipated.  Time:  I spent 8 minutes with the patient via telehealth technology discussing the above problems/concerns.    Evelina Dun, FNP

## 2022-05-18 ENCOUNTER — Telehealth: Payer: 59 | Admitting: Physician Assistant

## 2022-05-18 DIAGNOSIS — J00 Acute nasopharyngitis [common cold]: Secondary | ICD-10-CM

## 2022-05-18 MED ORDER — FLUTICASONE PROPIONATE 50 MCG/ACT NA SUSP: 2.0000 | Freq: Every day | NASAL | 0 refills | Status: AC

## 2022-05-18 MED ORDER — AZITHROMYCIN 250 MG PO TABS: ORAL_TABLET | ORAL | 0 refills | Status: DC

## 2022-05-18 NOTE — Patient Instructions (Signed)
Bruce Cross, thank you for joining Margaretann Loveless, PA-C for today's virtual visit.  While this provider is not your primary care provider (PCP), if your PCP is located in our provider database this encounter information will be shared with them immediately following your visit.   A [facility] MyChart account gives you access to today's visit and all your visits, tests, and labs performed at [facility] " click here if you don't have a [facility] MyChart account or go to [email or web address]  Consent: (Patient) Bruce Cross provided verbal consent for this virtual visit at the beginning of the encounter.  Current Medications:  Current Outpatient Medications:    azithromycin (ZITHROMAX) 250 MG tablet, Take 2 tablets on day 1, then 1 tablet daily on days 2 through 5, Disp: 6 tablet, Rfl: 0   fluticasone (FLONASE) 50 MCG/ACT nasal spray, Place 2 sprays into both nostrils daily., Disp: 16 g, Rfl: 0   albuterol (VENTOLIN HFA) 108 (90 Base) MCG/ACT inhaler, Inhale 2 puffs into the lungs every 6 (six) hours as needed for wheezing or shortness of breath., Disp: , Rfl:    ARIPiprazole (ABILIFY) 30 MG tablet, Take 30 mg by mouth daily., Disp: , Rfl:    aspirin EC 81 MG EC tablet, Take 1 tablet (81 mg total) by mouth daily. Swallow whole., Disp: 30 tablet, Rfl: 11   atorvastatin (LIPITOR) 80 MG tablet, Take 1 tablet (80 mg total) by mouth daily. (Patient taking differently: Take 80 mg by mouth at bedtime.), Disp: 30 tablet, Rfl: 3   buPROPion (WELLBUTRIN XL) 300 MG 24 hr tablet, Take 300 mg by mouth daily., Disp: , Rfl:    citalopram (CELEXA) 20 MG tablet, Take 20 mg by mouth daily., Disp: , Rfl:    levocetirizine (XYZAL) 5 MG tablet, Take 5 mg by mouth daily as needed for allergies., Disp: , Rfl:    levofloxacin (LEVAQUIN) 500 MG tablet, Take 1 tablet (500 mg total) by mouth daily., Disp: 7 tablet, Rfl: 0   metFORMIN (GLUCOPHAGE-XR) 500 MG 24 hr tablet, Take 1,000 mg by  mouth 2 (two) times daily with a meal., Disp: , Rfl:    pantoprazole (PROTONIX) 40 MG tablet, Take 40 mg by mouth 2 (two) times daily before a meal., Disp: , Rfl:    traZODone (DESYREL) 50 MG tablet, Take 1 tablet (50 mg total) by mouth at bedtime., Disp: 12 tablet, Rfl: 0   Medications ordered in this encounter:  Meds ordered this encounter  Medications   azithromycin (ZITHROMAX) 250 MG tablet    Sig: Take 2 tablets on day 1, then 1 tablet daily on days 2 through 5    Dispense:  6 tablet    Refill:  0    Order Specific Question:   Supervising Provider    Answer:   Merrilee Jansky [[id number]]   fluticasone (FLONASE) 50 MCG/ACT nasal spray    Sig: Place 2 sprays into both nostrils daily.    Dispense:  16 g    Refill:  0    Order Specific Question:   Supervising Provider    Answer:   Merrilee Jansky [id number]     *If you need refills on other medications prior to your next appointment, please contact your pharmacy*  Follow-Up: Call back or seek an in-person evaluation if the symptoms worsen or if the condition fails to improve as anticipated.  [facility] [phone]  Other Instructions Pharyngitis  Pharyngitis is inflammation of the throat (pharynx). It  is a very common cause of sore throat. Pharyngitis can be caused by a bacteria, but it is usually caused by a virus. Most cases of pharyngitis get better on their own without treatment. What are the causes? This condition may be caused by: Infection by viruses (viral). Viral pharyngitis spreads easily from person to person (is contagious) through coughing, sneezing, and sharing of personal items or utensils such as cups, forks, spoons, and toothbrushes. Infection by bacteria (bacterial). Bacterial pharyngitis may be spread by touching the nose or face after coming in contact with the bacteria, or through close contact, such as kissing. Allergies. Allergies can cause buildup of mucus in the throat (post-nasal drip),  leading to inflammation and irritation. Allergies can also cause blocked nasal passages, forcing breathing through the mouth, which dries and irritates the throat. What increases the risk? You are more likely to develop this condition if: You are 62-60 years old. You are exposed to crowded environments such as daycare, school, or dormitory living. You live in a cold climate. You have a weakened disease-fighting (immune) system. What are the signs or symptoms? Symptoms of this condition vary by the cause. Common symptoms of this condition include: Sore throat. Fatigue. Low-grade fever. Stuffy nose (nasal congestion) and cough. Headache. Other symptoms may include: Glands in the neck (lymph nodes) that are swollen. Skin rashes. Plaque-like film on the throat or tonsils. This is often a symptom of bacterial pharyngitis. Vomiting. Red, itchy eyes (conjunctivitis). Loss of appetite. Joint pain and muscle aches. Enlarged tonsils. How is this diagnosed? This condition may be diagnosed based on your medical history and a physical exam. Your health care provider will ask you questions about your illness and your symptoms. A swab of your throat may be done to check for bacteria (rapid strep test). Other lab tests may also be done, depending on the suspected cause, but these are rare. How is this treated? Many times, treatment is not needed for this condition. Pharyngitis usually gets better in 3-4 days without treatment. Bacterial pharyngitis may be treated with antibiotic medicines. Follow these instructions at home: Medicines Take over-the-counter and prescription medicines only as told by your health care provider. If you were prescribed an antibiotic medicine, take it as told by your health care provider. Do not stop taking the antibiotic even if you start to feel better. Use throat sprays to soothe your throat as told by your health care provider. Children can get pharyngitis. Do not give  your child aspirin because of the association with Reye's syndrome. Managing pain To help with pain, try: Sipping warm liquids, such as broth, herbal tea, or warm water. Eating or drinking cold or frozen liquids, such as frozen ice pops. Gargling with a mixture of salt and water 3-4 times a day or as needed. To make salt water, completely dissolve -1 tsp (3-6 g) of salt in 1 cup (237 mL) of warm water. Sucking on hard candy or throat lozenges. Putting a cool-mist humidifier in your bedroom at night to moisten the air. Sitting in the bathroom with the door closed for 5-10 minutes while you run hot water in the shower.  General instructions  Do not use any products that contain nicotine or tobacco. These products include cigarettes, chewing tobacco, and vaping devices, such as e-cigarettes. If you need help quitting, ask your health care provider. Rest as told by your health care provider. Drink enough fluid to keep your urine pale yellow. How is this prevented? To help prevent becoming infected  or spreading infection: Wash your hands often with soap and water for at least 20 seconds. If soap and water are not available, use hand sanitizer. Do not touch your eyes, nose, or mouth with unwashed hands, and wash hands after touching these areas. Do not share cups or eating utensils. Avoid close contact with people who are sick. Contact a health care provider if: You have large, tender lumps in your neck. You have a rash. You cough up green, yellow-brown, or bloody mucus. Get help right away if: Your neck becomes stiff. You drool or are unable to swallow liquids. You cannot drink or take medicines without vomiting. You have severe pain that does not go away, even after you take medicine. You have trouble breathing, and it is not caused by a stuffy nose. You have new pain and swelling in your joints such as the knees, ankles, wrists, or elbows. These symptoms may represent a serious problem  that is an emergency. Do not wait to see if the symptoms will go away. Get medical help right away. Call your local emergency services (911 in the [place] Do not drive yourself to the hospital. Summary Pharyngitis is redness, pain, and swelling (inflammation) of the throat (pharynx). While pharyngitis can be caused by a bacteria, the most common causes are viral. Most cases of pharyngitis get better on their own without treatment. Bacterial pharyngitis is treated with antibiotic medicines. This information is not intended to replace advice given to you by your health care provider. Make sure you discuss any questions you have with your health care provider. Document Revised: 04/08/2020 Document Reviewed: 04/08/2020 Elsevier Patient Education  2023 Elsevier [employer].    If you have been instructed to have an in-person evaluation today at a local Urgent Care facility, please use the link below. It will take you to a list of all of our available [facility], including address, phone number and hours of operation. Please do not delay care.  [facility]  If you or a family member do not have a primary care provider, use the link below to schedule a visit and establish care. When you choose a [facility] primary care physician or advanced practice provider, you gain a long-term partner in health. Find a Primary Care Provider  Learn more about [facility]'s in-office and virtual care options:  - Get Care Now

## 2022-05-18 NOTE — Progress Notes (Signed)
Virtual Visit Consent   Bruce Cross, you are scheduled for a virtual visit with a [facility] provider today. Just as with appointments in the office, your consent must be obtained to participate. Your consent will be active for this visit and any virtual visit you may have with one of our providers in the next 365 days. If you have a MyChart account, a copy of this consent can be sent to you electronically.  As this is a virtual visit, video technology does not allow for your provider to perform a traditional examination. This may limit your provider's ability to fully assess your condition. If your provider identifies any concerns that need to be evaluated in person or the need to arrange testing (such as labs, EKG, etc.), we will make arrangements to do so. Although advances in technology are sophisticated, we cannot ensure that it will always work on either your end or our end. If the connection with a video visit is poor, the visit may have to be switched to a telephone visit. With either a video or telephone visit, we are not always able to ensure that we have a secure connection.  By engaging in this virtual visit, you consent to the provision of healthcare and authorize for your insurance to be billed (if applicable) for the services provided during this visit. Depending on your insurance coverage, you may receive a charge related to this service.  I need to obtain your verbal consent now. Are you willing to proceed with your visit today? Bruce Cross has provided verbal consent on 05/18/2022 for a virtual visit (video or telephone). Bruce Loveless, PA-C  Date: 05/18/2022 8:11 AM  Virtual Visit via Video Note   I, Bruce Cross, connected with  Bruce Cross  ([id number], [birth year]) on 05/18/22 at  8:15 AM EDT by a video-enabled telemedicine application and verified that I am speaking with the correct person using two identifiers.  Location: Patient: Virtual Visit Location  Patient: Home Provider: Virtual Visit Location Provider: Home Office   I discussed the limitations of evaluation and management by telemedicine and the availability of in person appointments. The patient expressed understanding and agreed to proceed.    History of Present Illness: Bruce Cross is a 32 y.o. who identifies as a male who was assigned male at birth, and is being seen today for fever and swollen lymph nodes.  HPI: Fever  This is a new problem. The current episode started in the past 7 days. The problem has been gradually worsening. The maximum temperature noted was 101 to 101.9 F. The temperature was taken using a tympanic thermometer. Associated symptoms include congestion, coughing (mild), ear pain, headaches and a sore throat. Pertinent negatives include no chest pain, diarrhea, nausea or vomiting. Associated symptoms comments: Sinus congestion, rhinorrhea, post nasal drainage. He has tried NSAIDs for the symptoms. The treatment provided no relief.     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 10/16/2020   Mixed hyperlipidemia 10/16/2020   Obstructive sleep apnea syndrome 10/16/2020   Severe episode of recurrent major depressive disorder 10/16/2020   Type 2 diabetes mellitus 10/16/2020   Obesity, morbid, BMI 50 or higher 09/19/2019   Hypertension    GERD (gastroesophageal reflux disease)    OCD (obsessive compulsive disorder)  History of CVA (cerebrovascular accident) 09/18/2019   Moderate persistent asthma without complication 10/14/2003    Allergies:  Allergies  Allergen Reactions   Amoxicillin Anaphylaxis, Diarrhea, Nausea Only and Other (See Comments)    "Total body discomfort" and GI upset, also   Cat Hair Extract Shortness Of Breath    Doxycycline Anaphylaxis, Anxiety and Other (See Comments)    Total body discomfort and Stomach inflammation, also   Pecan Nut (Diagnostic) Anaphylaxis, Swelling and Other (See Comments)    "Pharyngeal swelling"   Telmisartan Other (See Comments)    Headache   Sertraline Hypertension   Montelukast Other (See Comments)    Confusion    Semaglutide Diarrhea and Nausea And Vomiting   Medications:  Current Outpatient Medications:    albuterol (VENTOLIN HFA) 108 (90 Base) MCG/ACT inhaler, Inhale 2 puffs into the lungs every 6 (six) hours as needed for wheezing or shortness of breath., Disp: , Rfl:    ARIPiprazole (ABILIFY) 30 MG tablet, Take 30 mg by mouth daily., Disp: , Rfl:    aspirin EC 81 MG EC tablet, Take 1 tablet (81 mg total) by mouth daily. Swallow whole., Disp: 30 tablet, Rfl: 11   atorvastatin (LIPITOR) 80 MG tablet, Take 1 tablet (80 mg total) by mouth daily. (Patient taking differently: Take 80 mg by mouth at bedtime.), Disp: 30 tablet, Rfl: 3   buPROPion (WELLBUTRIN XL) 300 MG 24 hr tablet, Take 300 mg by mouth daily., Disp: , Rfl:    citalopram (CELEXA) 20 MG tablet, Take 20 mg by mouth daily., Disp: , Rfl:    levocetirizine (XYZAL) 5 MG tablet, Take 5 mg by mouth daily as needed for allergies., Disp: , Rfl:    levofloxacin (LEVAQUIN) 500 MG tablet, Take 1 tablet (500 mg total) by mouth daily., Disp: 7 tablet, Rfl: 0   metFORMIN (GLUCOPHAGE-XR) 500 MG 24 hr tablet, Take 1,000 mg by mouth 2 (two) times daily with a meal., Disp: , Rfl:    pantoprazole (PROTONIX) 40 MG tablet, Take 40 mg by mouth 2 (two) times daily before a meal., Disp: , Rfl:    traZODone (DESYREL) 50 MG tablet, Take 1 tablet (50 mg total) by mouth at bedtime., Disp: 12 tablet, Rfl: 0  Observations/Objective: Patient is well-developed, well-nourished in no acute distress.  Resting comfortably at home.  Head is normocephalic, atraumatic.  No labored breathing.  Speech is clear and coherent with logical  content.  Patient is alert and oriented at baseline.    Assessment and Plan: There are no diagnoses linked to this encounter. - Worsening symptoms that have not responded to OTC medications.  - Will give Azithromycin and Flonase - Continue allergy medications.  - Steam and humidifier can help - Stay well hydrated and get plenty of rest.  - Seek in person evaluation if no symptom improvement or if symptoms worsen   Follow Up Instructions: I discussed the assessment and treatment plan with the patient. The patient was provided an opportunity to ask questions and all were answered. The patient agreed with the plan and demonstrated an understanding of the instructions.  A copy of instructions were sent to the patient via MyChart unless otherwise noted below.    The patient was advised to call back or seek an in-person evaluation if the symptoms worsen or if the condition fails to improve as anticipated.  Time:  I spent 10 minutes with the patient via telehealth technology discussing the above problems/concerns.    Bruce Loveless, PA-C

## 2022-05-23 ENCOUNTER — Ambulatory Visit
Admission: RE | Admit: 2022-05-23 | Discharge: 2022-05-23 | Disposition: A | Discharge status: Home / Self Care | Payer: 59 | Source: Ambulatory Visit | Attending: Family Medicine | Admitting: Family Medicine

## 2022-05-23 VITALS — BP 125/84 | HR 82 | Temp 97.5°F | Resp 18

## 2022-05-23 DIAGNOSIS — J029 Acute pharyngitis, unspecified: Secondary | ICD-10-CM | POA: Diagnosis not present

## 2022-05-23 DIAGNOSIS — K121 Other forms of stomatitis: Secondary | ICD-10-CM

## 2022-05-23 LAB — POCT RAPID STREP A (OFFICE): Rapid Strep A Screen: NEGATIVE

## 2022-05-23 MED ORDER — PREDNISONE 20 MG PO TABS: ORAL_TABLET | ORAL | 0 refills | Status: AC

## 2022-05-23 MED ORDER — SULFAMETHOXAZOLE-TRIMETHOPRIM 800-160 MG PO TABS: 1.0000 | ORAL_TABLET | Freq: Two times a day (BID) | ORAL | 0 refills | Status: AC

## 2022-05-23 NOTE — Discharge Instructions (Addendum)
Instructed patient to take medication as directed with food to completion.  Advised patient to take prednisone with first dose of Bactrim for the next 5 of 10 days.  Encouraged increase daily water intake to 64 ounces per day while taking these medications.  Advised if symptoms worsen and/or unresolved please follow-up with PCP or ENT for further evaluation.

## 2022-05-23 NOTE — ED Provider Notes (Signed)
[facility]    CSN: [id number] Arrival date & time: 05/23/22  1045      History   Chief Complaint Chief Complaint  Patient presents with   Sore Throat    HPI Bruce Cross is a 32 y.o. male.   HPI Pleasant 32 year old male presents with sore throat and fever for 1 week reports temperature max of 103.0 on Friday, 05/20/2022.  Additionally, patient is concerned with reddish area under tongue and swollen lymph nodes of neck.  Patient was prescribed Zithromax on 05/18/22 (via e-visit) which he finished yesterday.  Reports taking OTC Aleve and Tylenol.  PMH significant for morbid obesity, OSA syndrome, history of CVA, and AKI.  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 No pertinent surgical history.     Home Medications    Prior to Admission medications   Medication Sig Start Date End Date Taking? Authorizing Provider  predniSONE (DELTASONE) 20 MG tablet Take 3 tabs PO daily x 5 days. 05/23/22  Yes Becket Iha, FNP  sulfamethoxazole-trimethoprim (BACTRIM DS) 800-160 MG tablet Take 1 tablet by mouth 2 (two) times  daily for 10 days. 05/23/22 06/02/22 Yes Yale Iha, FNP  albuterol (VENTOLIN HFA) 108 (90 Base) MCG/ACT inhaler Inhale 2 puffs into the lungs every 6 (six) hours as needed for wheezing or shortness of breath.    [provider]  ARIPiprazole (ABILIFY) 30 MG tablet Take 30 mg by mouth daily.    [provider]  aspirin EC 81 MG EC tablet Take 1 tablet (81 mg total) by mouth daily. Swallow whole. 09/20/19   Regalado, Belkys A, MD  atorvastatin (LIPITOR) 80 MG tablet Take 1 tablet (80 mg total) by mouth daily. Patient taking differently: Take 80 mg by mouth at bedtime. 09/20/19   Regalado, Belkys A, MD  buPROPion (WELLBUTRIN XL) 300 MG 24 hr tablet Take 300 mg by mouth daily.    [provider]  citalopram (CELEXA) 20 MG tablet Take 20 mg by mouth daily.    [provider]  fluticasone (FLONASE) 50 MCG/ACT nasal spray Place 2 sprays into both nostrils daily. 05/18/22   Margaretann Loveless, PA-C  levocetirizine (XYZAL) 5 MG tablet Take 5 mg by mouth daily as needed for allergies.    [provider]  levofloxacin (LEVAQUIN) 500 MG tablet Take 1 tablet (500 mg total) by mouth daily. 01/23/22   Junie Spencer, FNP  metFORMIN (GLUCOPHAGE-XR) 500 MG 24 hr tablet Take 1,000 mg by mouth 2 (two) times daily with a meal.    [provider]  pantoprazole (PROTONIX) 40 MG tablet Take 40 mg by mouth 2 (two) times daily before a meal.  [provider]  traZODone (DESYREL) 50 MG tablet Take 1 tablet (50 mg total) by mouth at bedtime. 10/17/21   Danford, Earl Lites, MD    Family History Family History  Problem Relation Age of Onset   Lupus Mother     Social History Social History   Tobacco Use   Smoking status: Never   Smokeless tobacco: Never  Vaping Use   Vaping Use: Never used  Substance Use Topics   Alcohol use: Not Currently   Drug use: Never     Allergies   Amoxicillin, Cat hair extract, Doxycycline, Pecan nut (diagnostic),  Telmisartan, Sertraline, Montelukast, and Semaglutide   Review of Systems Review of Systems  HENT:  Positive for sore throat.   All other systems reviewed and are negative.    Physical Exam Triage Vital Signs ED Triage Vitals  Enc Vitals Group     BP 05/23/22 1050 125/84     Pulse Rate 05/23/22 1050 82     Resp 05/23/22 1050 18     Temp 05/23/22 1050 (!) 97.5 F (36.4 C)     Temp Source 05/23/22 1050 Oral     SpO2 05/23/22 1050 100 %     Weight --      Height --      Head Circumference --      Peak Flow --      Pain Score 05/23/22 1051 4     Pain Loc --      Pain Edu? --      Excl. in GC? --    No data found.  Updated Vital Signs BP 125/84 (BP Location: Right Arm)   Pulse 82   Temp (!) 97.5 F (36.4 C) (Oral)   Resp 18   SpO2 100%     Physical Exam Vitals and nursing note reviewed.  Constitutional:      Appearance: He is obese. He is ill-appearing.  HENT:     Head: Normocephalic and atraumatic.     Right Ear: Tympanic membrane, ear canal and external ear normal.     Left Ear: Tympanic membrane, ear canal and external ear normal.     Mouth/Throat:     Mouth: Mucous membranes are moist.     Pharynx: Oropharynx is clear. Uvula midline. No pharyngeal swelling, posterior oropharyngeal erythema or uvula swelling.     Tonsils: 0 on the right. 0 on the left.     Comments: Posterior tongue (base of frenulum linguae/sublingual papilla) ~1.0 cm linear whitish ulceration noted-please see image below Eyes:     Conjunctiva/sclera: Conjunctivae normal.     Pupils: Pupils are equal, round, and reactive to light.  Neck:     Comments: Moderate anterior cervical lymphadenopathy noted TTP Cardiovascular:     Rate and Rhythm: Normal rate and regular rhythm.     Pulses: Normal pulses.     Heart sounds: Normal heart sounds.  Pulmonary:     Effort: Pulmonary effort is normal.     Breath sounds: Normal breath sounds.  Abdominal:     General: Bowel sounds are normal.      Palpations: Abdomen is soft.  Musculoskeletal:     Cervical back: Normal range of motion and neck supple. Tenderness present.  Lymphadenopathy:     Cervical: Cervical adenopathy present.  Skin:    General: Skin is warm and dry.  Neurological:     General: No focal deficit present.     Mental Status: He is alert and oriented to person, place,  and time.  Psychiatric:        Mood and Affect: Mood normal.        Behavior: Behavior normal.      UC Treatments / Results  Labs (all labs ordered are listed, but only abnormal results are displayed) Labs Reviewed  CULTURE, GROUP A STREP [facility])  POCT RAPID STREP A (OFFICE)    EKG   Radiology No results found.  Procedures Procedures (including critical care time)  Medications Ordered in UC Medications - No data to display  Initial Impression / Assessment and Plan / UC Course  I have reviewed the triage vital signs and the nursing notes.  Pertinent labs & imaging results that were available during my care of the patient were reviewed by me and considered in my medical decision making (see chart for details).     MDM: 1.  Ulceration of oral mucosa-Rx'd Bactrim 800/160 mg twice daily x 10 days; 2. Pharyngitis, unspecified etiology-Rx'd Prednisone 60 mg daily x 5 days, rapid strep negative, throat culture ordered. Instructed patient to take medication as directed with food to completion.  Advised patient to take prednisone with first dose of Bactrim for the next 5 of 10 days.  Encouraged increase daily water intake to 64 ounces per day while taking these medications.  Advised if symptoms worsen and/or unresolved please follow-up with PCP or ENT for further evaluation.  Discharged home, hemodynamically stable. Final Clinical Impressions(s) / UC Diagnoses   Final diagnoses:  Pharyngitis, unspecified etiology  Ulceration of oral mucosa     Discharge Instructions      Instructed patient to take medication as directed with food to  completion.  Advised patient to take prednisone with first dose of Bactrim for the next 5 of 10 days.  Encouraged increase daily water intake to 64 ounces per day while taking these medications.  Advised if symptoms worsen and/or unresolved please follow-up with PCP or ENT for further evaluation.     ED Prescriptions     Medication Sig Dispense Auth. Provider   predniSONE (DELTASONE) 20 MG tablet Take 3 tabs PO daily x 5 days. 15 tablet Marrion Iha, FNP   sulfamethoxazole-trimethoprim (BACTRIM DS) 800-160 MG tablet Take 1 tablet by mouth 2 (two) times daily for 10 days. 20 tablet Blayde Iha, FNP      PDMP not reviewed this encounter.   [place] Iha, FNP 05/23/22 1159

## 2022-05-23 NOTE — ED Triage Notes (Signed)
Pt c/o sore throat and fever x 1 week. Tmax 103 on Fri. Was rx'd a zpak which he finished yesterday. Also taking aleve and tylenol.

## 2022-05-27 LAB — CULTURE, GROUP A STREP (THRC)

## 2022-07-28 IMAGING — MR MR MRA HEAD W/O CM
1 series · 19 of 48 positions shown · non-contrast
Comparison: Prior CTA from 09/18/2019.

CLINICAL DATA: Initial evaluation for acute neuro deficit, stroke
suspected. Left orbital headache.

EXAM:
MRA HEAD WITHOUT CONTRAST
TECHNIQUE: Angiographic images of the Circle of Willis were obtained using MRA
technique without intravenous contrast.

[Series 13: 3d cow · axial · 0.5mm · 0.41mm/px · z∈[-88,-7]mm · 19 of 172 slices shown]
[im 1/172]
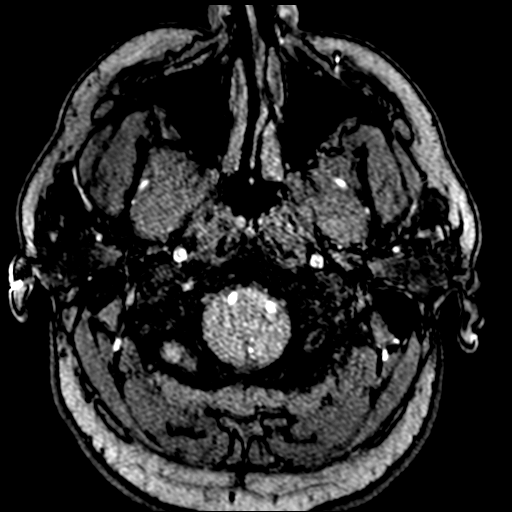
[im 4/172]
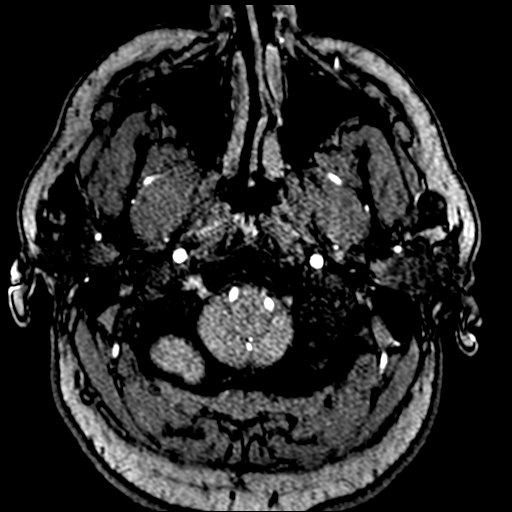
[im 8/172]
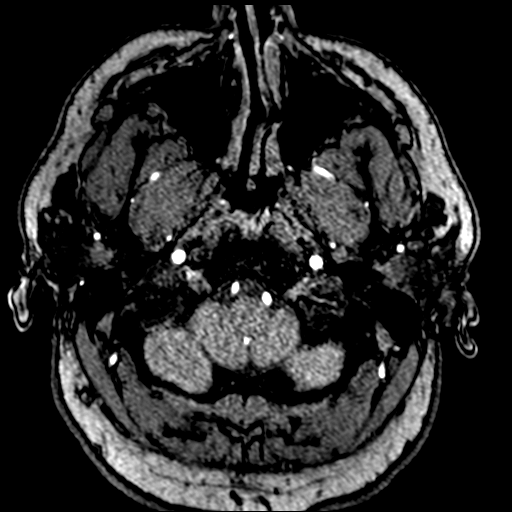
[im 11/172]
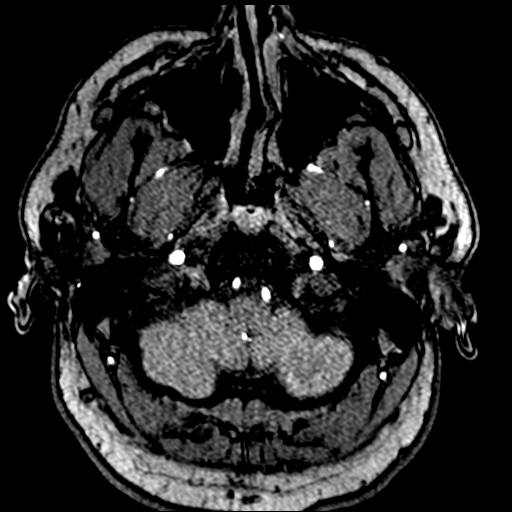
[im 15/172]
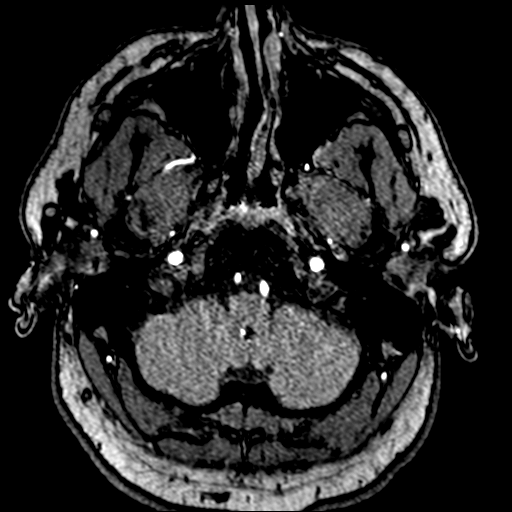
[im 19/172]
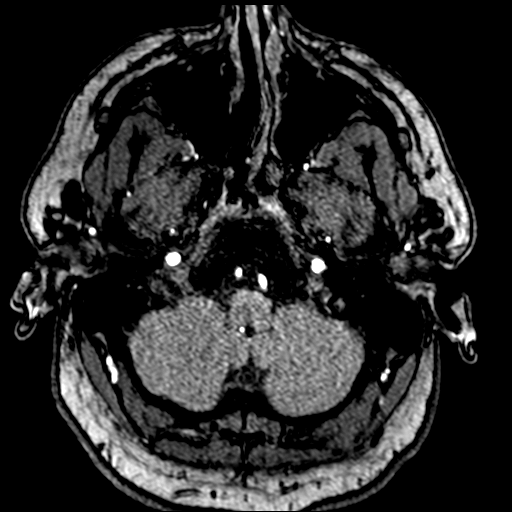
[im 22/172]
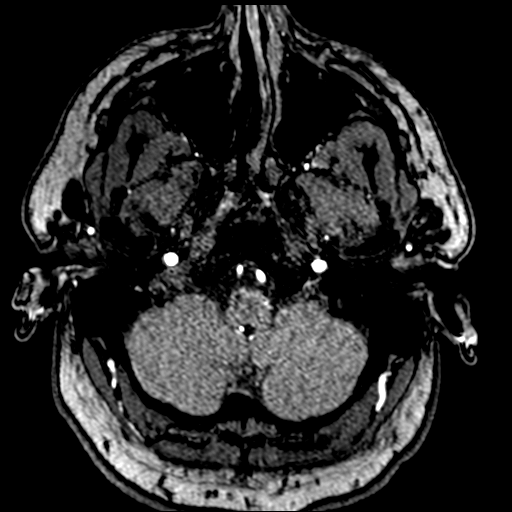
[im 26/172]
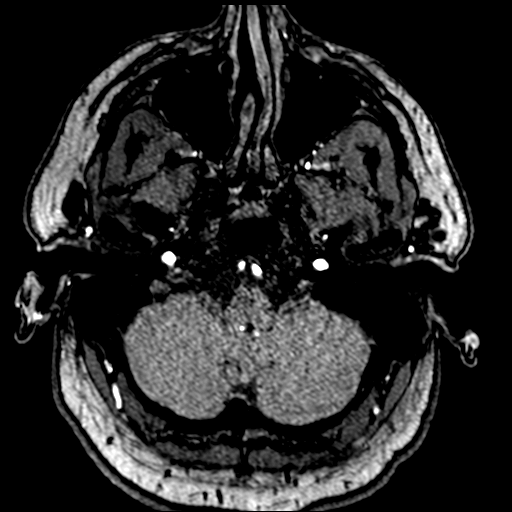
[im 30/172]
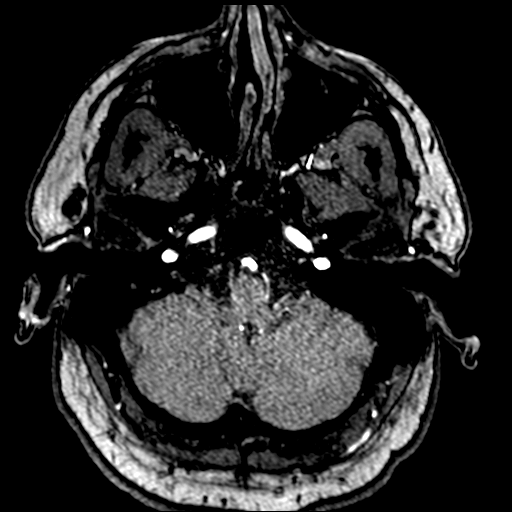
[im 33/172]
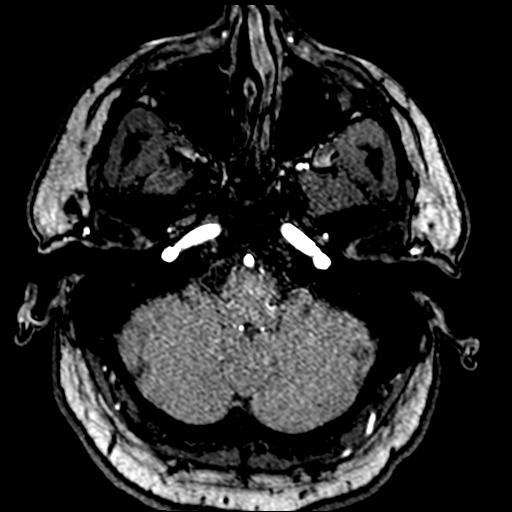
[im 37/172]
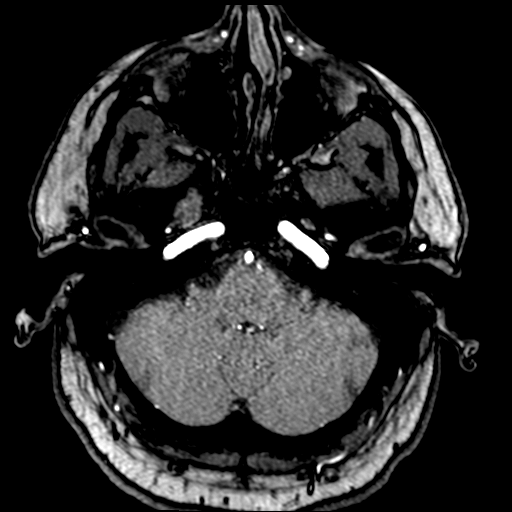
[im 55/172]
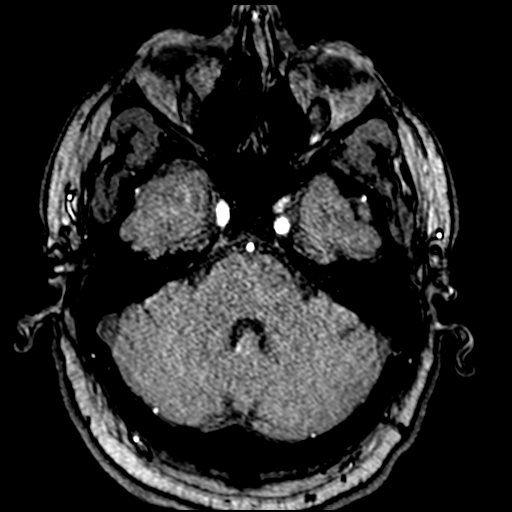
[im 77/172]
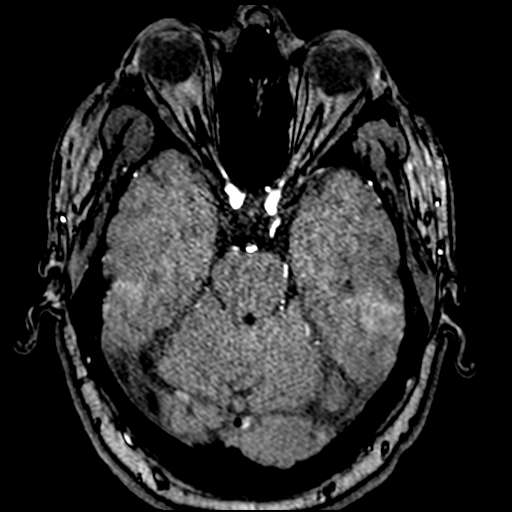
[im 88/172]
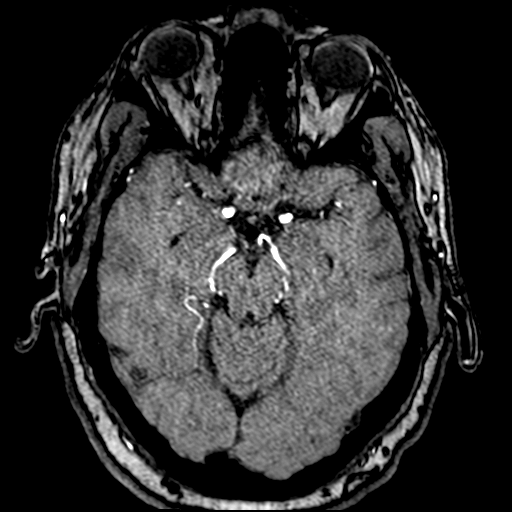
[im 99/172]
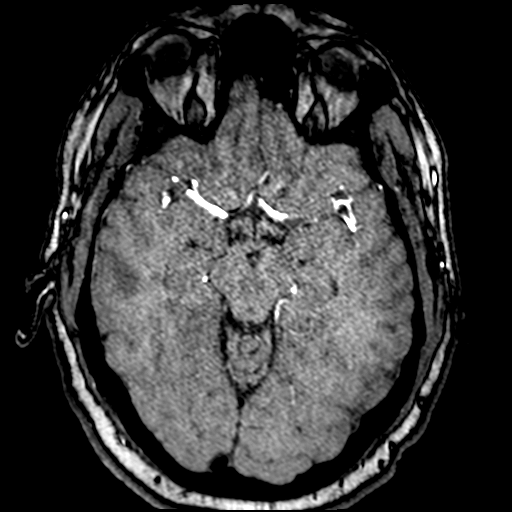
[im 121/172]
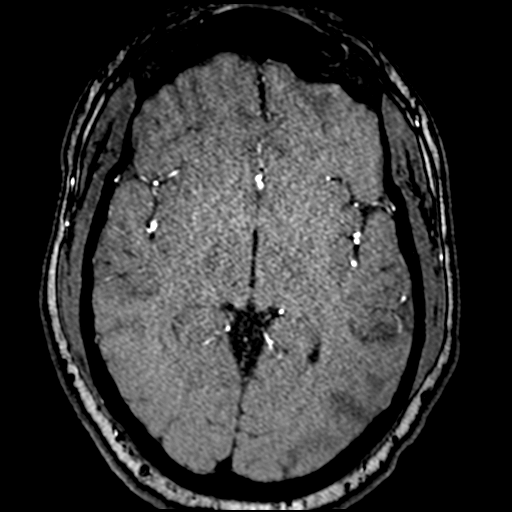
[im 142/172]
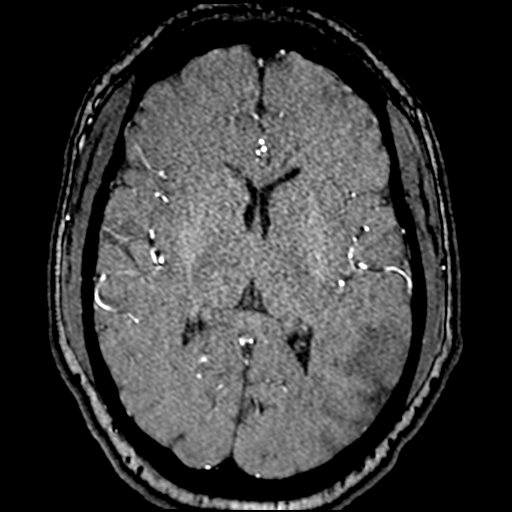
[im 146/172]
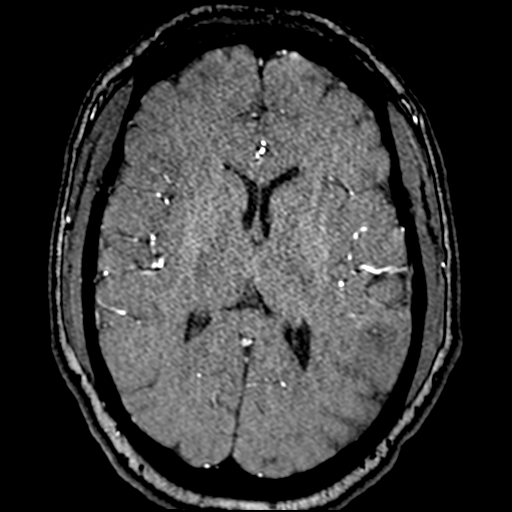
[im 164/172]
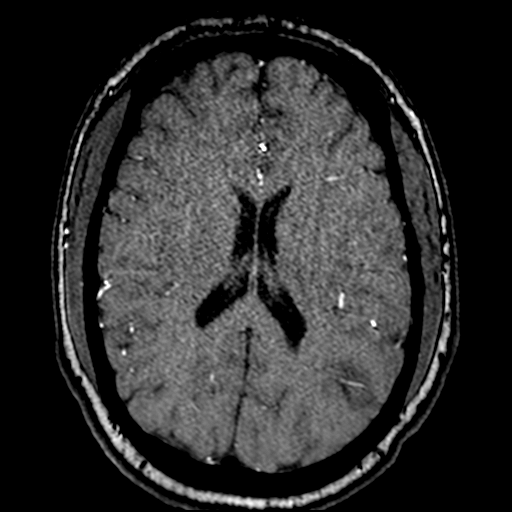

[19 of 48 positions shown; findings below may reference images not displayed]

FINDINGS: ANTERIOR CIRCULATION:

Visualized distal cervical segments of the internal carotid arteries
are widely patent with symmetric antegrade flow. Petrous, cavernous,
and supraclinoid ICAs widely patent without stenosis or other
abnormality. Ophthalmic arteries patent proximally. ICA termini well
perfused. A1 segments patent bilaterally. Normal anterior
communicating artery complex. Anterior cerebral arteries widely
patent to their distal aspects without stenosis. No M1 stenosis or
occlusion. Normal MCA bifurcations. Distal MCA branches well
perfused and symmetric. No visible left MCA branch occlusion.

POSTERIOR CIRCULATION:

Vertebral arteries patent to the vertebrobasilar junction without
stenosis. Left vertebral artery slightly dominant. Neither PICA well
visualized. Basilar patent to its distal aspect without stenosis.
Superior cerebral arteries patent bilaterally. Right PCA supplied
primarily via the basilar. Left PCA supplied via a hypoplastic left
P1 segment and prominent left posterior communicating artery. Both
PCAs well perfused to their distal aspects without stenosis.

No intracranial aneurysm or other vascular abnormality.
IMPRESSION: Normal intracranial MRA. No large vessel occlusion, hemodynamically
significant stenosis, or other vascular abnormality. No aneurysm.

## 2022-07-28 IMAGING — MR MR HEAD W/O CM
9 of 10 series · 42 of 48 positions shown · non-contrast
Comparison: Prior CTA from 09/18/2019.

CLINICAL DATA: Follow-up examination for acute stroke.

EXAM:
MRI HEAD WITHOUT CONTRAST
TECHNIQUE: Multiplanar, multiecho pulse sequences of the brain and surrounding
structures were obtained without intravenous contrast.

[Series 5: DWI · axial · 3.0mm · 0.88mm/px · z∈[-103,+53]mm · 11 of 106 slices shown (1 of 4)]
[im 1/106]
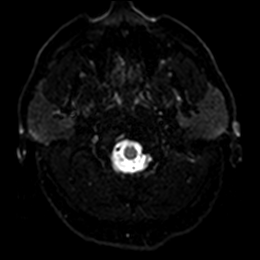
[im 11/106]
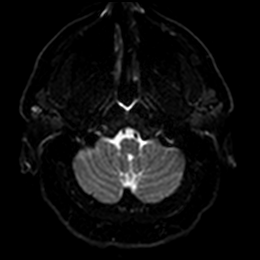
[im 22/106]
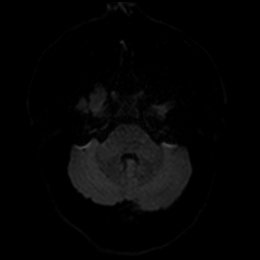
[im 32/106]
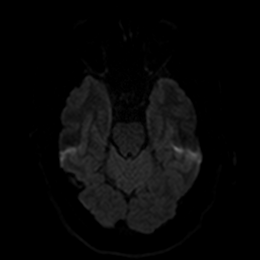
[im 43/106]
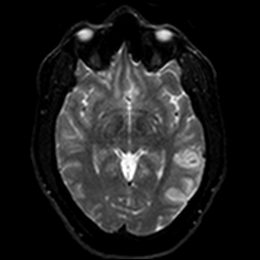
[im 53/106]
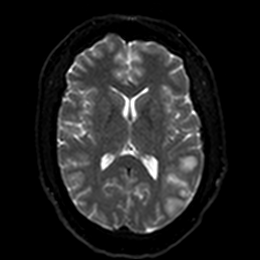
[im 64/106]
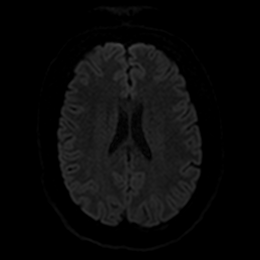
[im 74/106]
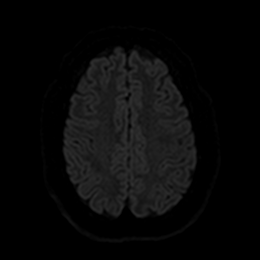
[im 85/106]
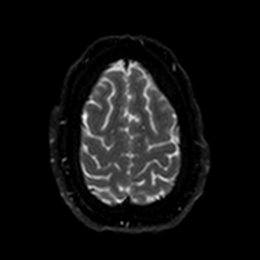
[im 95/106]
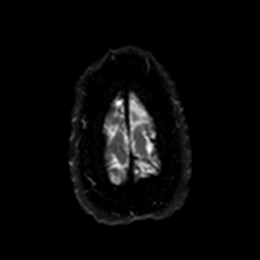
[im 106/106]
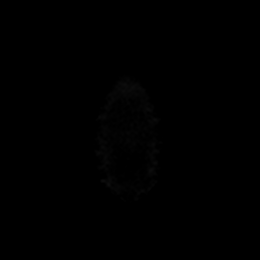

[Series 6: DWI · axial · 3.0mm · 0.88mm/px · z∈[-103,+53]mm · 5 of 53 slices shown (2 of 4)]
[im 1/53]
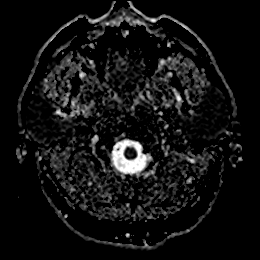
[im 14/53]
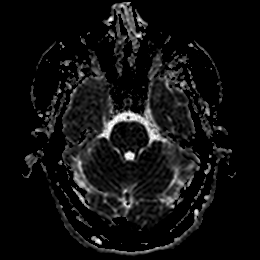
[im 27/53]
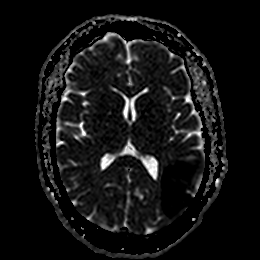
[im 40/53]
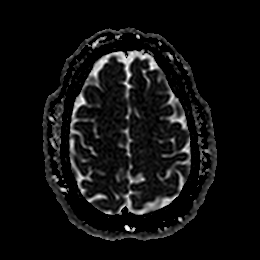
[im 53/53]
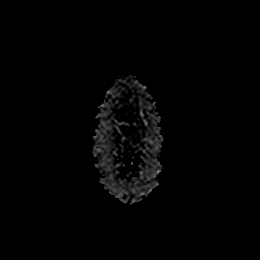

[Series 7: DWI · coronal · 4.0mm · 0.88mm/px · 6 of 70 slices shown (3 of 4)]
[im 1/70]
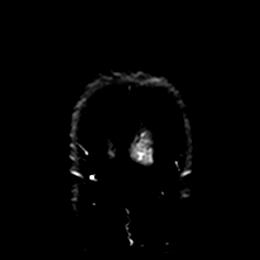
[im 14/70]
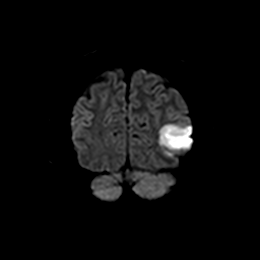
[im 28/70]
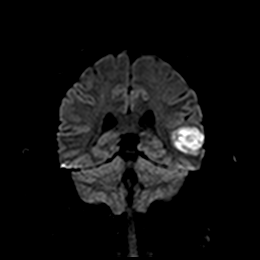
[im 42/70]
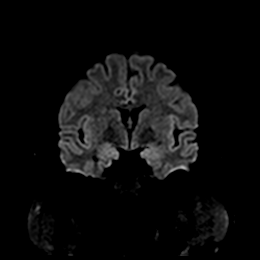
[im 56/70]
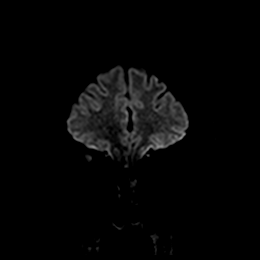
[im 70/70]
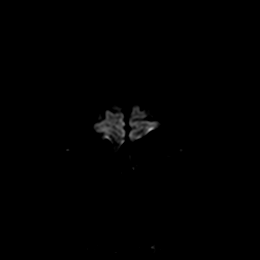

[Series 8: DWI · coronal · 4.0mm · 0.88mm/px · 3 of 35 slices shown (4 of 4)]
[im 1/35]
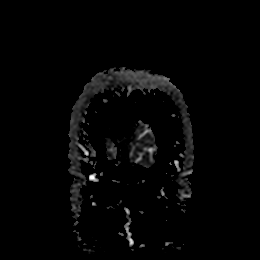
[im 18/35]
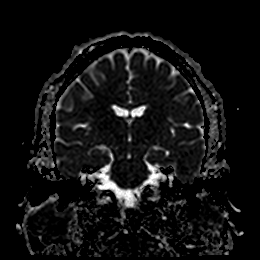
[im 35/35]
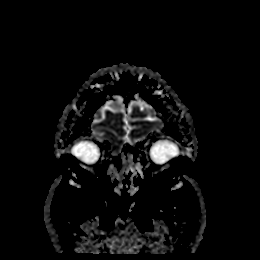

[Series 9: T1 · sagittal · 5.0mm · 0.78mm/px · 2 of 25 slices shown]
[im 1/25]
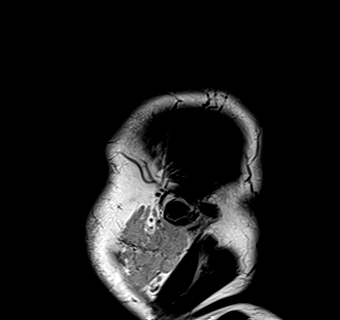
[im 25/25]
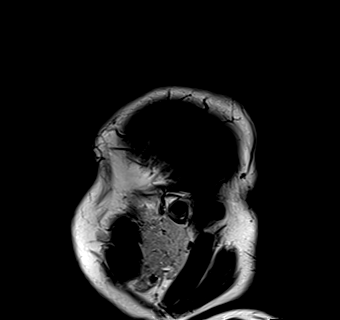

[Series 10: FLAIR · axial · 5.0mm · 0.45mm/px · z∈[-102,+54]mm · 2 of 27 slices shown]
[im 1/27]
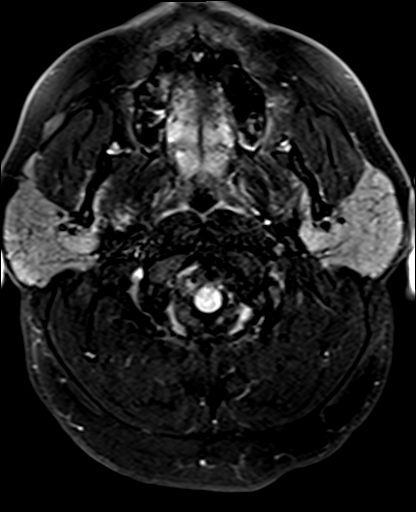
[im 27/27]
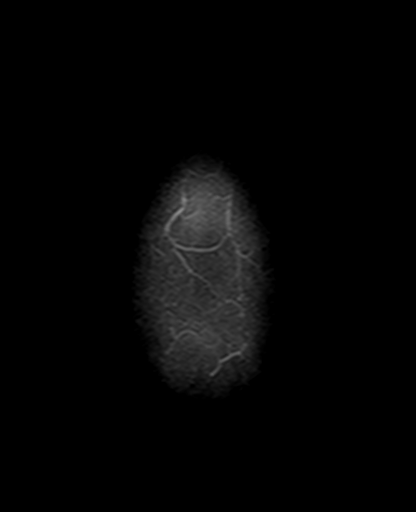

[Series 12: pha_images · axial · 3.0mm · 0.90mm/px · z∈[-112,+61]mm · 5 of 58 slices shown]
[im 1/58]
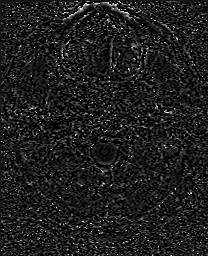
[im 15/58]
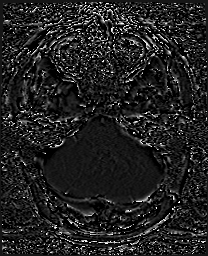
[im 29/58]
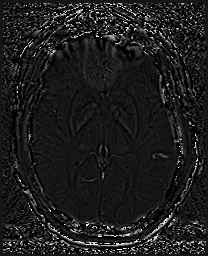
[im 43/58]
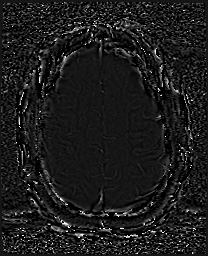
[im 58/58]
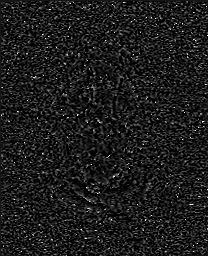

[Series 13: swi_images · axial · 3.0mm · 0.90mm/px · z∈[-112,+64]mm · 6 of 60 slices shown]
[im 1/60]
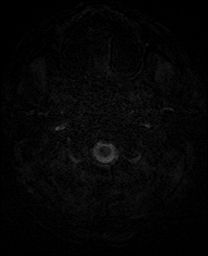
[im 12/60]
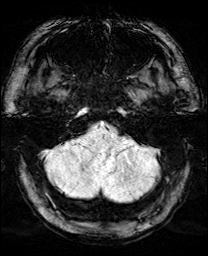
[im 24/60]
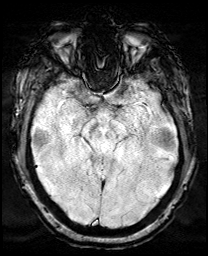
[im 36/60]
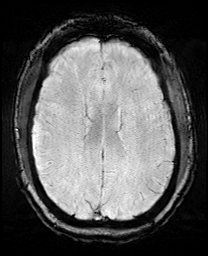
[im 48/60]
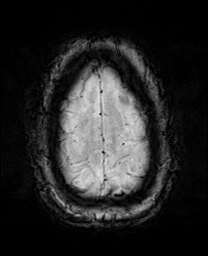
[im 60/60]
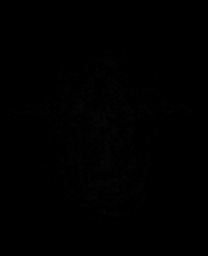

[Series 16: T2 · axial · 5.0mm · 0.72mm/px · z∈[-103,+41]mm · 2 of 25 slices shown]
[im 1/25]
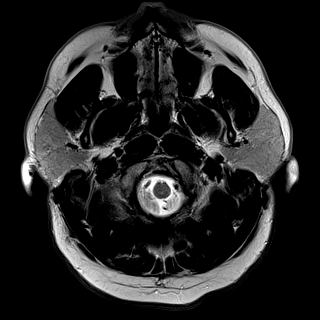
[im 25/25]
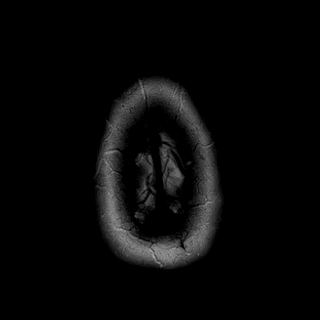

[42 of 48 positions shown; findings below may reference images not displayed]

FINDINGS: Brain: Moderate size confluent area of restricted diffusion
involving the left temporal occipital region again seen, consistent
with posterior left MCA territory infarct, corresponding with
abnormality on prior CT. Overall size and distribution is stable.
Associated mild petechial hemorrhage without frank hemorrhagic
transformation or significant regional mass effect.

Remainder the brain is normal in appearance. No other focal
parenchymal signal abnormality. No other evidence for acute or
chronic ischemia. No other foci of susceptibility artifact to
suggest acute or chronic intracranial hemorrhage.

No mass lesion, midline shift or mass effect. No hydrocephalus or
extra-axial fluid collection. Pituitary gland suprasellar region
normal. Midline structures intact.

Vascular: Major intracranial vascular flow voids are well maintained
and normal.

Skull and upper cervical spine: Craniocervical junction within
normal limits. Bone marrow signal intensity normal. No scalp soft
tissue abnormality.

Sinuses/Orbits: Globes and orbital soft tissues within normal
limits. Paranasal sinuses are largely clear. No significant mastoid
effusion. Inner ear structures grossly normal.

Other: None.
IMPRESSION: 1. Moderate-sized acute ischemic left MCA territory infarct
involving the left temporoccipital region, posterior left MCA
distribution. Associated mild petechial hemorrhage without frank
hemorrhagic transformation or significant regional mass effect.
2. Otherwise normal brain MRI.

## 2022-09-02 ENCOUNTER — Ambulatory Visit: Payer: 59

## 2022-09-02 ENCOUNTER — Inpatient Hospital Stay
Admission: RE | Admit: 2022-09-02 | Discharge: 2022-09-02 | Disposition: A | Discharge status: Home / Self Care | Payer: 59 | Source: Ambulatory Visit | Attending: Family Medicine | Admitting: Family Medicine
